# Patient Record
Sex: Male | Born: 1980 | Race: White | Hispanic: No | Marital: Married
Health system: Southern US, Community
[De-identification: ages and names within clinical notes are randomized; demographics above are authoritative.]

## PROBLEM LIST (undated history)

## (undated) DIAGNOSIS — I10 Essential (primary) hypertension: Secondary | ICD-10-CM

---

## 2018-01-02 ENCOUNTER — Emergency Department (HOSPITAL_COMMUNITY): Payer: BLUE CROSS/BLUE SHIELD

## 2018-01-02 ENCOUNTER — Encounter (HOSPITAL_COMMUNITY)
Admission: EM | Disposition: A | Discharge status: Home / Self Care | Payer: Self-pay | Source: Home / Self Care | Attending: Neurology

## 2018-01-02 ENCOUNTER — Emergency Department (HOSPITAL_COMMUNITY): Payer: BLUE CROSS/BLUE SHIELD | Admitting: Anesthesiology

## 2018-01-02 ENCOUNTER — Inpatient Hospital Stay (HOSPITAL_COMMUNITY)
Admission: EM | Admit: 2018-01-02 | Discharge: 2018-01-05 | DRG: 023 | Disposition: A | Discharge status: Home / Self Care | Payer: BLUE CROSS/BLUE SHIELD | Attending: Neurology | Admitting: Neurology

## 2018-01-02 DIAGNOSIS — R29702 NIHSS score 2: Secondary | ICD-10-CM | POA: Diagnosis not present

## 2018-01-02 DIAGNOSIS — E7849 Other hyperlipidemia: Secondary | ICD-10-CM | POA: Diagnosis present

## 2018-01-02 DIAGNOSIS — Z23 Encounter for immunization: Secondary | ICD-10-CM | POA: Diagnosis not present

## 2018-01-02 DIAGNOSIS — R29706 NIHSS score 6: Secondary | ICD-10-CM | POA: Diagnosis present

## 2018-01-02 DIAGNOSIS — H53461 Homonymous bilateral field defects, right side: Secondary | ICD-10-CM | POA: Diagnosis present

## 2018-01-02 DIAGNOSIS — R471 Dysarthria and anarthria: Secondary | ICD-10-CM | POA: Diagnosis present

## 2018-01-02 DIAGNOSIS — M7989 Other specified soft tissue disorders: Secondary | ICD-10-CM | POA: Diagnosis not present

## 2018-01-02 DIAGNOSIS — E782 Mixed hyperlipidemia: Secondary | ICD-10-CM | POA: Diagnosis not present

## 2018-01-02 DIAGNOSIS — I10 Essential (primary) hypertension: Secondary | ICD-10-CM | POA: Diagnosis present

## 2018-01-02 DIAGNOSIS — I63532 Cerebral infarction due to unspecified occlusion or stenosis of left posterior cerebral artery: Secondary | ICD-10-CM | POA: Diagnosis not present

## 2018-01-02 DIAGNOSIS — E785 Hyperlipidemia, unspecified: Secondary | ICD-10-CM | POA: Diagnosis not present

## 2018-01-02 DIAGNOSIS — R600 Localized edema: Secondary | ICD-10-CM | POA: Diagnosis not present

## 2018-01-02 DIAGNOSIS — E669 Obesity, unspecified: Secondary | ICD-10-CM | POA: Diagnosis present

## 2018-01-02 DIAGNOSIS — I6340 Cerebral infarction due to embolism of unspecified cerebral artery: Secondary | ICD-10-CM | POA: Diagnosis not present

## 2018-01-02 DIAGNOSIS — I63212 Cerebral infarction due to unspecified occlusion or stenosis of left vertebral arteries: Secondary | ICD-10-CM | POA: Diagnosis not present

## 2018-01-02 DIAGNOSIS — I639 Cerebral infarction, unspecified: Secondary | ICD-10-CM

## 2018-01-02 DIAGNOSIS — R1310 Dysphagia, unspecified: Secondary | ICD-10-CM | POA: Diagnosis present

## 2018-01-02 DIAGNOSIS — Q208 Other congenital malformations of cardiac chambers and connections: Secondary | ICD-10-CM

## 2018-01-02 DIAGNOSIS — I7774 Dissection of vertebral artery: Secondary | ICD-10-CM | POA: Diagnosis present

## 2018-01-02 DIAGNOSIS — R609 Edema, unspecified: Secondary | ICD-10-CM | POA: Diagnosis not present

## 2018-01-02 DIAGNOSIS — Z6841 Body Mass Index (BMI) 40.0 and over, adult: Secondary | ICD-10-CM

## 2018-01-02 HISTORY — PX: IR INTRA CRAN STENT: IMG2345

## 2018-01-02 HISTORY — PX: IR PERCUTANEOUS ART THROMBECTOMY/INFUSION INTRACRANIAL INC DIAG ANGIO: IMG6087

## 2018-01-02 HISTORY — PX: IR ANGIO VERTEBRAL SEL VERTEBRAL UNI R MOD SED: IMG5368

## 2018-01-02 HISTORY — PX: RADIOLOGY WITH ANESTHESIA: SHX6223

## 2018-01-02 HISTORY — PX: IR ANGIO INTRA EXTRACRAN SEL COM CAROTID INNOMINATE BILAT MOD SED: IMG5360

## 2018-01-02 HISTORY — PX: IR CT HEAD LTD: IMG2386

## 2018-01-02 HISTORY — DX: Essential (primary) hypertension: I10

## 2018-01-02 LAB — I-STAT CHEM 8, ED
BUN: 14 mg/dL (ref 6–20)
CHLORIDE: 105 mmol/L (ref 98–111)
CREATININE: 0.8 mg/dL (ref 0.61–1.24)
Calcium, Ion: 1.04 mmol/L — ABNORMAL LOW (ref 1.15–1.40)
GLUCOSE: 122 mg/dL — AB (ref 70–99)
HCT: 46 % (ref 39.0–52.0)
Hemoglobin: 15.6 g/dL (ref 13.0–17.0)
POTASSIUM: 3.2 mmol/L — AB (ref 3.5–5.1)
Sodium: 140 mmol/L (ref 135–145)
TCO2: 23 mmol/L (ref 22–32)

## 2018-01-02 LAB — COMPREHENSIVE METABOLIC PANEL
ALT: 34 U/L (ref 0–44)
AST: 27 U/L (ref 15–41)
Albumin: 4.2 g/dL (ref 3.5–5.0)
Alkaline Phosphatase: 58 U/L (ref 38–126)
Anion gap: 14 (ref 5–15)
BUN: 12 mg/dL (ref 6–20)
CALCIUM: 9.1 mg/dL (ref 8.9–10.3)
CO2: 22 mmol/L (ref 22–32)
CREATININE: 0.85 mg/dL (ref 0.61–1.24)
Chloride: 104 mmol/L (ref 98–111)
Glucose, Bld: 120 mg/dL — ABNORMAL HIGH (ref 70–99)
POTASSIUM: 3.1 mmol/L — AB (ref 3.5–5.1)
Sodium: 140 mmol/L (ref 135–145)
Total Bilirubin: 1 mg/dL (ref 0.3–1.2)
Total Protein: 7.2 g/dL (ref 6.5–8.1)

## 2018-01-02 LAB — PROTIME-INR
INR: 1.04
Prothrombin Time: 13.5 seconds (ref 11.4–15.2)

## 2018-01-02 LAB — DIFFERENTIAL
ABS IMMATURE GRANULOCYTES: 0.03 10*3/uL (ref 0.00–0.07)
BASOS ABS: 0.1 10*3/uL (ref 0.0–0.1)
BASOS PCT: 1 %
Eosinophils Absolute: 0.1 10*3/uL (ref 0.0–0.5)
Eosinophils Relative: 1 %
Immature Granulocytes: 0 %
Lymphocytes Relative: 25 %
Lymphs Abs: 2.2 10*3/uL (ref 0.7–4.0)
MONOS PCT: 10 %
Monocytes Absolute: 0.8 10*3/uL (ref 0.1–1.0)
NEUTROS PCT: 63 %
Neutro Abs: 5.6 10*3/uL (ref 1.7–7.7)

## 2018-01-02 LAB — CBC
HCT: 46.1 % (ref 39.0–52.0)
Hemoglobin: 15.6 g/dL (ref 13.0–17.0)
MCH: 30.8 pg (ref 26.0–34.0)
MCHC: 33.8 g/dL (ref 30.0–36.0)
MCV: 91.1 fL (ref 80.0–100.0)
NRBC: 0 % (ref 0.0–0.2)
Platelets: 242 10*3/uL (ref 150–400)
RBC: 5.06 MIL/uL (ref 4.22–5.81)
RDW: 12.5 % (ref 11.5–15.5)
WBC: 8.7 10*3/uL (ref 4.0–10.5)

## 2018-01-02 LAB — RAPID URINE DRUG SCREEN, HOSP PERFORMED
AMPHETAMINES: NOT DETECTED
BARBITURATES: NOT DETECTED
BENZODIAZEPINES: NOT DETECTED
COCAINE: NOT DETECTED
OPIATES: NOT DETECTED
Tetrahydrocannabinol: NOT DETECTED

## 2018-01-02 LAB — CBG MONITORING, ED: Glucose-Capillary: 109 mg/dL — ABNORMAL HIGH (ref 70–99)

## 2018-01-02 LAB — MRSA PCR SCREENING: MRSA by PCR: NEGATIVE

## 2018-01-02 LAB — APTT: APTT: 25 s (ref 24–36)

## 2018-01-02 LAB — I-STAT TROPONIN, ED: TROPONIN I, POC: 0 ng/mL (ref 0.00–0.08)

## 2018-01-02 SURGERY — IR WITH ANESTHESIA
Anesthesia: General

## 2018-01-02 MED ORDER — TICAGRELOR 90 MG PO TABS: 90.0000 mg | ORAL_TABLET | Freq: Two times a day (BID) | ORAL | Status: DC

## 2018-01-02 MED ORDER — PROPOFOL 10 MG/ML IV BOLUS
INTRAVENOUS | Status: DC | PRN
  Administered 2018-01-02: 200 mg via INTRAVENOUS

## 2018-01-02 MED ORDER — NITROGLYCERIN 1 MG/10 ML FOR IR/CATH LAB
INTRA_ARTERIAL | Status: AC | PRN
  Administered 2018-01-02: 25 ug via INTRA_ARTERIAL

## 2018-01-02 MED ORDER — ASPIRIN 81 MG PO CHEW: 81.0000 mg | CHEWABLE_TABLET | Freq: Every day | ORAL | Status: DC

## 2018-01-02 MED ORDER — SENNOSIDES-DOCUSATE SODIUM 8.6-50 MG PO TABS: 1.0000 | ORAL_TABLET | Freq: Every evening | ORAL | Status: DC | PRN

## 2018-01-02 MED ORDER — FENTANYL CITRATE (PF) 100 MCG/2ML IJ SOLN
INTRAMUSCULAR | Status: AC
  Filled 2018-01-02: qty 2

## 2018-01-02 MED ORDER — ASPIRIN 325 MG PO TABS
ORAL_TABLET | ORAL | Status: AC
  Filled 2018-01-02: qty 1

## 2018-01-02 MED ORDER — PHENYLEPHRINE HCL 10 MG/ML IJ SOLN
INTRAMUSCULAR | Status: DC | PRN
  Administered 2018-01-02 (×3): 40 ug via INTRAVENOUS

## 2018-01-02 MED ORDER — TIROFIBAN HCL IN NACL 5-0.9 MG/100ML-% IV SOLN
INTRAVENOUS | Status: AC
  Filled 2018-01-02: qty 100

## 2018-01-02 MED ORDER — CEFAZOLIN SODIUM-DEXTROSE 2-3 GM-%(50ML) IV SOLR
INTRAVENOUS | Status: DC | PRN
  Administered 2018-01-02: 2 g via INTRAVENOUS

## 2018-01-02 MED ORDER — IOPAMIDOL (ISOVUE-370) INJECTION 76%
100.0000 mL | Freq: Once | INTRAVENOUS | Status: AC | PRN
  Administered 2018-01-02: 40 mL via INTRAVENOUS

## 2018-01-02 MED ORDER — FENTANYL CITRATE (PF) 250 MCG/5ML IJ SOLN
INTRAMUSCULAR | Status: DC | PRN
  Administered 2018-01-02 (×3): 50 ug via INTRAVENOUS

## 2018-01-02 MED ORDER — LIDOCAINE HCL (CARDIAC) PF 100 MG/5ML IV SOSY
PREFILLED_SYRINGE | INTRAVENOUS | Status: DC | PRN
  Administered 2018-01-02: 100 mg via INTRATRACHEAL

## 2018-01-02 MED ORDER — EPTIFIBATIDE 20 MG/10ML IV SOLN
INTRAVENOUS | Status: AC
  Filled 2018-01-02: qty 10

## 2018-01-02 MED ORDER — SUCCINYLCHOLINE CHLORIDE 20 MG/ML IJ SOLN
INTRAMUSCULAR | Status: DC | PRN
  Administered 2018-01-02: 100 mg via INTRAVENOUS

## 2018-01-02 MED ORDER — CLEVIDIPINE BUTYRATE 0.5 MG/ML IV EMUL
0.0000 mg/h | INTRAVENOUS | Status: DC
  Administered 2018-01-02: 10 mg/h via INTRAVENOUS
  Administered 2018-01-02 – 2018-01-03 (×7): 32 mg/h via INTRAVENOUS
  Filled 2018-01-02: qty 50
  Filled 2018-01-02: qty 100
  Filled 2018-01-02 (×2): qty 50
  Filled 2018-01-02 (×2): qty 100

## 2018-01-02 MED ORDER — IOPAMIDOL (ISOVUE-300) INJECTION 61%
INTRAVENOUS | Status: AC
  Filled 2018-01-02: qty 50

## 2018-01-02 MED ORDER — LABETALOL HCL 5 MG/ML IV SOLN
20.0000 mg | Freq: Once | INTRAVENOUS | Status: AC
  Administered 2018-01-02 (×2): 5 mg via INTRAVENOUS
  Administered 2018-01-02: 10 mg via INTRAVENOUS

## 2018-01-02 MED ORDER — IOPAMIDOL (ISOVUE-370) INJECTION 76%
100.0000 mL | Freq: Once | INTRAVENOUS | Status: AC | PRN
  Administered 2018-01-02: 100 mL via INTRAVENOUS

## 2018-01-02 MED ORDER — CEFAZOLIN SODIUM-DEXTROSE 2-4 GM/100ML-% IV SOLN
INTRAVENOUS | Status: AC
  Filled 2018-01-02: qty 100

## 2018-01-02 MED ORDER — ACETAMINOPHEN 325 MG PO TABS
650.0000 mg | ORAL_TABLET | ORAL | Status: DC | PRN
  Administered 2018-01-02 – 2018-01-04 (×6): 650 mg via ORAL
  Filled 2018-01-02 (×6): qty 2

## 2018-01-02 MED ORDER — SODIUM CHLORIDE 0.9 % IV SOLN
INTRAVENOUS | Status: DC | PRN
  Administered 2018-01-02 (×2): via INTRAVENOUS

## 2018-01-02 MED ORDER — ASPIRIN 81 MG PO CHEW
81.0000 mg | CHEWABLE_TABLET | Freq: Every day | ORAL | Status: DC
  Administered 2018-01-03 – 2018-01-05 (×3): 81 mg via ORAL
  Filled 2018-01-02 (×3): qty 1

## 2018-01-02 MED ORDER — IOHEXOL 300 MG/ML  SOLN
150.0000 mL | Freq: Once | INTRAMUSCULAR | Status: AC | PRN
  Administered 2018-01-02: 75 mL via INTRA_ARTERIAL

## 2018-01-02 MED ORDER — NICARDIPINE HCL IN NACL 20-0.86 MG/200ML-% IV SOLN: 0.0000 mg/h | INTRAVENOUS | Status: DC

## 2018-01-02 MED ORDER — CLOPIDOGREL BISULFATE 300 MG PO TABS
ORAL_TABLET | ORAL | Status: AC
  Filled 2018-01-02: qty 1

## 2018-01-02 MED ORDER — SODIUM CHLORIDE 0.9 % IV SOLN: INTRAVENOUS | Status: DC

## 2018-01-02 MED ORDER — TICAGRELOR 90 MG PO TABS
ORAL_TABLET | ORAL | Status: AC
  Filled 2018-01-02: qty 2

## 2018-01-02 MED ORDER — IOHEXOL 300 MG/ML  SOLN
150.0000 mL | Freq: Once | INTRAMUSCULAR | Status: AC | PRN
  Administered 2018-01-02: 50 mL via INTRA_ARTERIAL

## 2018-01-02 MED ORDER — TICAGRELOR 90 MG PO TABS
90.0000 mg | ORAL_TABLET | Freq: Two times a day (BID) | ORAL | Status: DC
  Administered 2018-01-03 (×2): 90 mg via ORAL
  Filled 2018-01-02 (×3): qty 1

## 2018-01-02 MED ORDER — ONDANSETRON HCL 4 MG/2ML IJ SOLN
INTRAMUSCULAR | Status: DC | PRN
  Administered 2018-01-02: 4 mg via INTRAVENOUS

## 2018-01-02 MED ORDER — SODIUM CHLORIDE 0.9 % IV SOLN: 50.0000 mL | Freq: Once | INTRAVENOUS | Status: DC

## 2018-01-02 MED ORDER — SODIUM CHLORIDE 0.9 % IV SOLN
INTRAVENOUS | Status: DC | PRN
  Administered 2018-01-02: 20 ug/min via INTRAVENOUS

## 2018-01-02 MED ORDER — SODIUM CHLORIDE 0.9 % IV SOLN
INTRAVENOUS | Status: DC
  Administered 2018-01-02: 20:00:00 via INTRAVENOUS

## 2018-01-02 MED ORDER — ROCURONIUM BROMIDE 100 MG/10ML IV SOLN
INTRAVENOUS | Status: DC | PRN
  Administered 2018-01-02 (×3): 50 mg via INTRAVENOUS

## 2018-01-02 MED ORDER — LIDOCAINE HCL 1 % IJ SOLN
INTRAMUSCULAR | Status: AC
  Filled 2018-01-02: qty 20

## 2018-01-02 MED ORDER — ACETAMINOPHEN 325 MG PO TABS: 650.0000 mg | ORAL_TABLET | ORAL | Status: DC | PRN

## 2018-01-02 MED ORDER — SUGAMMADEX SODIUM 500 MG/5ML IV SOLN
INTRAVENOUS | Status: DC | PRN
  Administered 2018-01-02: 500 mg via INTRAVENOUS

## 2018-01-02 MED ORDER — NITROGLYCERIN 1 MG/10 ML FOR IR/CATH LAB
INTRA_ARTERIAL | Status: AC
  Filled 2018-01-02: qty 10

## 2018-01-02 MED ORDER — LABETALOL HCL 5 MG/ML IV SOLN
20.0000 mg | Freq: Once | INTRAVENOUS | Status: AC
  Administered 2018-01-02: 20 mg via INTRAVENOUS
  Filled 2018-01-02: qty 4

## 2018-01-02 MED ORDER — LABETALOL HCL 5 MG/ML IV SOLN
INTRAVENOUS | Status: DC | PRN
  Administered 2018-01-02: 10 mg via INTRAVENOUS

## 2018-01-02 MED ORDER — ASPIRIN EC 81 MG PO TBEC
DELAYED_RELEASE_TABLET | ORAL | Status: AC | PRN
  Administered 2018-01-02: 325 mg via ORAL

## 2018-01-02 MED ORDER — ACETAMINOPHEN 650 MG RE SUPP: 650.0000 mg | RECTAL | Status: DC | PRN

## 2018-01-02 MED ORDER — ACETAMINOPHEN 160 MG/5ML PO SOLN: 650.0000 mg | ORAL | Status: DC | PRN

## 2018-01-02 MED ORDER — TICAGRELOR 60 MG PO TABS
ORAL_TABLET | ORAL | Status: AC | PRN
  Administered 2018-01-02: 180 mg via NASOGASTRIC

## 2018-01-02 MED ORDER — STROKE: EARLY STAGES OF RECOVERY BOOK
Freq: Once | Status: DC
  Filled 2018-01-02: qty 1

## 2018-01-02 MED ORDER — ALTEPLASE (STROKE) FULL DOSE INFUSION
90.0000 mg | Freq: Once | INTRAVENOUS | Status: AC
  Administered 2018-01-02: 90 mg via INTRAVENOUS
  Filled 2018-01-02: qty 100

## 2018-01-02 MED ORDER — FAMOTIDINE IN NACL 20-0.9 MG/50ML-% IV SOLN
20.0000 mg | Freq: Two times a day (BID) | INTRAVENOUS | Status: DC
  Administered 2018-01-03: 20 mg via INTRAVENOUS
  Filled 2018-01-02: qty 50

## 2018-01-02 MED ORDER — LABETALOL HCL 5 MG/ML IV SOLN
10.0000 mg | INTRAVENOUS | Status: DC | PRN
  Administered 2018-01-02: 10 mg via INTRAVENOUS
  Filled 2018-01-02: qty 4

## 2018-01-02 NOTE — H&P (Addendum)
Neurology Consultation  CC: Right-sided weakness, slurred speech  History is obtained from patient  HPI: Gary Steele is a 37 y.o. male, who is visiting [place] from out of town for a work meeting, with past medical history of hypertension, who was in his usual state of health till about 1 PM on 01/02/2018 when he suddenly started noticing blurred vision.  Soon after his colleagues noted that his speech was slurred and EMS was called immediately and he was brought into the emergency room for evaluation. On initial evaluation, he had a bizarre looking, nearly functional exam with possible right hemianopsia but he also reported blurred vision on his left visual field.  He was able to name some objects but not able to read the cards. Noncontrast CT of the head was performed which did not show any acute changes.  CT angiogram of the head and neck was performed and on first glance and preliminary review did not show any acute abnormalities  Due to the inconsistent exam and no certain findings on the CT and CTA initially, he was taken in for a stat MRI of the brain that had shown DWI changes in the left PCA territory.  While the patient was an MRI, CTA was reviewed again by radiology and was reported to have a left vertebral artery dissection and a possible left PCA-P2 segment occlusion with widely patent cervical carotid arteries. IV TPA was not initially started because of inconsistent exam and preliminary findings of normal imaging, but with the abnormalities on CT as well as early DWI changes, TPA was then started with some delay. Following IV TPA demonstration, he was taken into the IR suite for endovascular thrombectomy. Upon obtaining more history from him as we are waiting in the IR suite, he said that he woke up with some neck discomfort this morning but had no neurological symptoms that were focal.  The focal neurological symptoms including blurred vision and slurred speech all started after  1 PM. He has never had similar symptoms in the past.  He has not had chiropractic maneuver.  He has not had any whiplash injury to the neck.  LKW: 1 PM on 01/02/2018 tpa given?:  Yes Delays in the process of giving TPA and endovascular thrombectomy- unclear and inconsistent exam to begin with, preliminary reading of CTA not remarkable for large vessel occlusion due to poor bolus timing. Premorbid modified Rankin scale (mRS):0 ROS: Review of systems was performed and is negative except as noted in the HPI  Past medical history is significant for hypertension  No family history of strokes  Social History:  Denies tobacco or illicit drug use. Reports 3-4 drinks of alcohol every night.  Medications  Current Facility-Administered Medications:  .   stroke: mapping our early stages of recovery book, , Does not apply, Once, Ulice Dash, PA-C .  alteplase (ACTIVASE) 1 mg/mL infusion 90 mg, 90 mg, Intravenous, Once, Last Rate: 90 mL/hr at 01/02/18 1451, 90 mg at 01/02/18 1451 **FOLLOWED BY** 0.9 %  sodium chloride infusion, 50 mL, Intravenous, Once, Milon Dikes, MD .  0.9 %  sodium chloride infusion, , Intravenous, Continuous, Ulice Dash, PA-C .  acetaminophen (TYLENOL) tablet 650 mg, 650 mg, Oral, Q4H PRN **OR** acetaminophen (TYLENOL) solution 650 mg, 650 mg, Per Tube, Q4H PRN **OR** acetaminophen (TYLENOL) suppository 650 mg, 650 mg, Rectal, Q4H PRN, Ulice Dash, PA-C .  aspirin 325 MG tablet, , , ,  .  ceFAZolin (ANCEF) 2-4 GM/100ML-% IVPB, , , ,  .  clopidogrel (PLAVIX) 300 MG tablet, , , ,  .  eptifibatide (INTEGRILIN) 20 MG/10ML injection, , , ,  .  famotidine (PEPCID) IVPB 20 mg premix, 20 mg, Intravenous, Q12H, Ulice Dash, PA-C .  labetalol (NORMODYNE,TRANDATE) injection 20 mg, 20 mg, Intravenous, Once **AND** nicardipine (CARDENE) 20mg  in 0.86% saline IV infusion (0.1 mg/ml), 0-15 mg/hr, Intravenous, Continuous, Milon Dikes, MD .  lidocaine (XYLOCAINE) 1 % (with  pres) injection, , , ,  .  lidocaine (XYLOCAINE) 1 % (with pres) injection, , , ,  .  nitroGLYCERIN 100 mcg/mL intra-arterial injection, , , ,  .  senna-docusate (Senokot-S) tablet 1 tablet, 1 tablet, Oral, QHS PRN, Ulice Dash, PA-C .  ticagrelor (BRILINTA) 90 MG tablet, , , ,  .  tirofiban (AGGRASTAT) 5-0.9 MG/100ML-% injection, , , ,  No current outpatient medications on file.  Facility-Administered Medications Ordered in Other Encounters:  .  0.9 %  sodium chloride infusion, , , Continuous PRN, Jeani Hawking, CRNA .  fentaNYL (SUBLIMAZE) injection, , , Anesthesia Intra-op, Jeani Hawking, CRNA, 50 mcg at 01/02/18 1511 .  labetalol (NORMODYNE,TRANDATE) injection, , , Anesthesia Intra-op, Jeani Hawking, CRNA, 10 mg at 01/02/18 1517 .  lidocaine (cardiac) 100 mg/53mL (XYLOCAINE) injection 2%, , Tracheal Tube, Anesthesia Intra-op, Jeani Hawking, CRNA, 100 mg at 01/02/18 1511 .  propofol (DIPRIVAN) 10 mg/mL bolus/IV push, , , Anesthesia Intra-op, Jeani Hawking, CRNA, 200 mg at 01/02/18 1511 .  rocuronium (ZEMURON) injection, , , Anesthesia Intra-op, Jeani Hawking, CRNA, 50 mg at 01/02/18 1519 .  succinylcholine (ANECTINE) injection, , , Anesthesia Intra-op, Jeani Hawking, CRNA, 100 mg at 01/02/18 1511  Exam: Current vital signs: BP 128/73 Comment: rechecked on IR monitor, new BP 155/99  Pulse (!) 106   Temp 98 F (36.7 C) (Oral)   Resp 19   Wt 126.9 kg   SpO2 97%  Vital signs in last 24 hours: Temp:  [98 F (36.7 C)] 98 F (36.7 C) (10/08 1450) Pulse Rate:  [100-106] 106 (10/08 1500) Resp:  [19-20] 19 (10/08 1500) BP: (128-168)/(73-102) 128/73 (10/08 1500) SpO2:  [97 %] 97 % (10/08 1500) Weight:  [126.9 kg] 126.9 kg (10/08 1300) General: Obese man in no apparent distress HEENT: Normocephalic atraumatic dry mucous membranes Lungs: Clear to auscultation bilaterally with no wheezing Cardiovascular: S1-S2 heard regular rate rhythm no murmur rub  gallop Extremities: Warm well perfused Abdomen nondistended nontender Neurological exam Patient awake, alert, seems to be aware of the fact that he is visiting a city outside of where he lives for a conference but could not name the city.  Was aware of the date. During the course of this encounter, he was aware that he was in [facility]. His speech was mildly dysarthric. He was able to name simple objects such as pen, watch but was unable to name the objects on the cart as he said that he cannot see it in either the right or the left visual field. 's comp attention was intact His repetition was intact Cranial nerves: Pupils were 6 mm bilaterally and reactive, extraocular movements were intact, on initial exam he had right homonymous hemianopsia and also reported blurred vision on the left visual fields but was able to see fingers moving on the left visual field.  No facial droop or weakness was noted.  No facial sensation changes.  Tongue midline.  Uvula midline.  Shoulder shrug intact. Motor exam: Initial encounter was able to lift  his right arm and leg up against gravity with no drift but later on had drift in the right upper extremity.  No drift in the left upper lower extremity. Sensory exam: Was able to initially feel the right side although less than the left but later on revealed that he has no sensation on the right upper or lower extremity. Coordination: Unable to perform on the right.  Intact finger-nose-finger and heel-knee-shin on the left. Gait testing was deferred at this time DTRs were 2+ all over Initial NIH stroke scale-6 NIH stroke scale fluctuated from 4-8  Labs I have reviewed labs in epic and the results pertinent to this consultation are: CBC    Component Value Date/Time   WBC 8.7 01/02/2018 1350   RBC 5.06 01/02/2018 1350   HGB 15.6 01/02/2018 1356   HCT 46.0 01/02/2018 1356   PLT 242 01/02/2018 1350   MCV 91.1 01/02/2018 1350   MCH 30.8 01/02/2018  1350   MCHC 33.8 01/02/2018 1350   RDW 12.5 01/02/2018 1350   LYMPHSABS 2.2 01/02/2018 1350   MONOABS 0.8 01/02/2018 1350   EOSABS 0.1 01/02/2018 1350   BASOSABS 0.1 01/02/2018 1350   CMP     Component Value Date/Time   NA 140 01/02/2018 1356   K 3.2 (L) 01/02/2018 1356   CL 105 01/02/2018 1356   CO2 22 01/02/2018 1350   GLUCOSE 122 (H) 01/02/2018 1356   BUN 14 01/02/2018 1356   CREATININE 0.80 01/02/2018 1356   CALCIUM 9.1 01/02/2018 1350   PROT 7.2 01/02/2018 1350   ALBUMIN 4.2 01/02/2018 1350   AST 27 01/02/2018 1350   ALT 34 01/02/2018 1350   ALKPHOS 58 01/02/2018 1350   BILITOT 1.0 01/02/2018 1350   GFRNONAA >60 01/02/2018 1350   GFRAA >60 01/02/2018 1350   Imaging I have reviewed the images obtained:  CT-scan of the brain-noncontrast CT of the head-no acute changes CTA head and neck-preliminary review with radiology was negative for LVO but further review revealed a possible left V1 dissection and a left P2 occlusion- suboptimal quality of the fluid bolus. CT perfusion of the neck is uninterpretable. Stat limited MRI examination of the brain- moderately large acute left PCA infarct with smaller acute infarcts in the left cerebellum and right pons.  Assessment:  Mr. Mera is a 37 year old man with a history of hypertension, brought in as an acute code stroke for slurred speech and right-sided weakness, who on initial encounter and had an inconsistent exam and initial head CT that was unremarkable.  CT Angie of the head and neck was degraded by the suboptimal quality of the fluid bolus and preliminary review did not reveal a large vessel occlusion. Initially, TPA was not considered for his treatment due to inconsistent exam and no apparent abnormalities on the CT and CT Angie of head and neck.  He was taken for a stat MRI of the brain because his exam still was not normal and did not make much sense in terms of localization. As he was in the MRI, the CTA was reviewed  further by radiology and a possible acute left for dissection and left P2 occlusion was discovered. Right after the MRI was completed, he was given IV TPA after discussing risks and benefits and he consented to the TPA. Endovascular thrombectomy was also discussed with him and he consented to proceed and was taken to the IR suite.  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 -Hyperglycemia management per SSI to maintain glucose 140-180mg /dL. -PT/OT/ST therapies and recommendations when able  CNS -Close neuro monitoring  Dysarthria Dysphagia following cerebral infarction  -NPO until cleared by speech -ST -Advance diet as tolerated -May need PEG  Hemiplegia and hemiparesis following cerebral infarction affecting right dominant side -PT/OT -PM&R consult  RESP Being intubated for general anesthesia for the IR procedure. Upon arrival had no acute respiratory issues. We will attempt to extubate in the IR suite if possible.  If not, will request PCCM consultation for vent management  CV Essential (primary) hypertension -Blood pressure goals as above -TTE  Hyperlipidemia, unspecified  - Statin for goal LDL < 70  HEME No acute issues -Monitor -transfuse for hgb < 7  ENDO -goal HgbA1c < 7  GI/GU -Gentle hydration  Fluid/Electrolyte Disorders -Repeat labs -Replete electrolytes as necessary  ID -No active issues -Monitor clinically and with labs  Nutrition E66.9 Obesity  -diet consult  Prophylaxis DVT: SCDs until 24 hours post TPA scan shows no bleed GI: Per PCCM vent bundle if needed Bowel: Docusate senna  Diet: NPO until cleared by speech for bedside swallow evaluation  Code Status: Full Code    I spoke to his wife  Cammy Copa over the phone.  I explained to her the symptoms that he came with and the treatment that he is receiving.  She is on her way to [facility] now.  I have provided her with my cell phone number and will update her after the IR procedure.    THE FOLLOWING WERE PRESENT ON ADMISSION: Acute ischemic stroke, left vertebral artery dissection, hemiparesis, hemianopsia, dysarthria, dysphagia.  -- Milon Dikes, MD [facility] Pager: [phone] If 7pm to 7am, please call on call as listed on AMION.   CRITICAL CARE ATTESTATION This patient is critically ill and at significant risk of neurological worsening, death and care requires constant monitoring of vital signs, hemodynamics, respiratory, and cardiac monitoring. I spent 75  minutes of neurocritical care time performing neurological assessment, discussion with family, other specialists and medical decision making of high complexity in the care of  this patient.

## 2018-01-02 NOTE — ED Triage Notes (Signed)
Patient arrived via EMS, from [employer] a work [employer]). Patient's co-worker called EMS stating patient was confused and had slurred speech. EMS arrived to find that patient has right side weakness, slurred speech, confused, and blurry vision.  Last Known well was 1300

## 2018-01-02 NOTE — Code Documentation (Signed)
37yo male arriving to [facility] via [facility] at 26. Patient from a work conference where he describes having sudden onset blurred vision and right sided numbness at 1300. EMS called and activated a code stroke for slurred speech and right sided weakness. Stroke team at the bedside on patient arrival. Labs drawn and patient cleared for CT by Dr. Hyacinth Meeker. Patient to CT with team. CT completed. NIHSS 6, see documentation for details and code stroke times. Patient with right hemianopsia, loss of sensation on the right side and mild dysarthria on exam. Patient with inconsistent exam as he reports not being able to feel on the right side, however, responds to PIV start. CTA and CTP completed. Patient transported back to [facility]. Decision made to proceed to STAT MRI which was positive for acute infarct. Decision made to treat with tPA. Pharmacy notified and presented to MRI to mix tPA. BP reassessed and within tPA parameters. 9mg  tPA bolus given over 1 minute at 1451 followed by 81mg /hr for a total of 90mg  per pharmacy dosing. Of note, patient reports waking up with neck pain but denies neurological deficits until 1300 today. Denies recent neck manipulation or injury. Patient transferred to IR per MD and bedside handoff given to IR RN. Patient reassessed with no change in exam. Patient with DBP above tPA parameters, reassessed and within parameters on reassessment. Patient to be admitted to ICU post-procedure.

## 2018-01-02 NOTE — Progress Notes (Signed)
Patient ID: Gary Steele, male   DOB: Mar 04, 1981, 37 y.o.   MRN: [id number] INR. Post procedure dyna CT brain reveals no gross ICH,or mass effect. Patient extubated with out diifficulty. Maintaining O2 sats. Denies any H/As,N/V C/O tingling in Rt UE,and improved vision. RT UE mild pronation drift. Pupils  3 to 4 mm RT = LT briskly reactive. RT groin soft No hematoma.Distal pulses palpable DPs and PTs bilaterally. S.Leonila Speranza MD

## 2018-01-02 NOTE — Progress Notes (Signed)
Spoke with pt wife in 4N waiting area, she is appreciative of IR services. Gave report to [place], [place].

## 2018-01-02 NOTE — Progress Notes (Signed)
Post IR NIHSS 2 (right upper quadrantonopsia, sensory)  -- Milon Dikes, MD [facility] Pager: [phone] If 7pm to 7am, please call on call as listed on AMION.

## 2018-01-02 NOTE — Transfer of Care (Signed)
Immediate Anesthesia Transfer of Care Note  Patient: Gary Steele  Procedure(s) Performed: IR WITH ANESTHESIA (N/A )  Patient Location: PACU  Anesthesia Type:General  Level of Consciousness: awake, alert  and oriented  Airway & Oxygen Therapy: Patient Spontanous Breathing and Patient connected to nasal cannula oxygen  Post-op Assessment: Report given to RN, Post -op Vital signs reviewed and stable and Patient moving all extremities  Post vital signs: Reviewed and stable  Last Vitals:  Vitals Value Taken Time  BP    Temp    Pulse 83 01/02/2018  6:12 PM  Resp 15 01/02/2018  6:12 PM  SpO2 99 % 01/02/2018  6:12 PM  Vitals shown include unvalidated device data.  Last Pain:  Vitals:   01/02/18 1450  TempSrc: Oral  PainSc:          Complications: No apparent anesthesia complications

## 2018-01-02 NOTE — Progress Notes (Signed)
Report given at beside in PACU, groin level 0, Pt VSS.

## 2018-01-02 NOTE — Progress Notes (Signed)
IR RN states she will complete and document 18:15 NIH.

## 2018-01-02 NOTE — Anesthesia Procedure Notes (Signed)
Arterial Line Insertion Start/End10/10/2017 3:20 PM, 01/02/2018 3:23 PM Performed by: Quentin Ore, CRNA, CRNA  Patient location: OR. Preanesthetic checklist: patient identified, IV checked, site marked, risks and benefits discussed, surgical consent, monitors and equipment checked, pre-op evaluation, timeout performed and anesthesia consent Emergency situation Left, radial was placed Catheter size: 20 G Maximum sterile barriers used   Attempts: 2 Procedure performed without using ultrasound guided technique. Following insertion, dressing applied. Post procedure assessment: normal  Patient tolerated the procedure well with no immediate complications.

## 2018-01-02 NOTE — Sedation Documentation (Signed)
Patient in IR suite 2 in care of anesthesia. This RN and Leigh in room to assist

## 2018-01-02 NOTE — Progress Notes (Signed)
CT scan performed in IR

## 2018-01-02 NOTE — Progress Notes (Signed)
Pharmacist Code Stroke Response  Notified to mix tPA at 1442 by Dr. Wilford Corner Delivered tPA to RN at 1449  tPA dose = 9mg  bolus over 1 minute followed by 81mg  for a total dose of 90mg  over 1 hour  Issues/delays encountered (if applicable): Pharmacy previously told pt would not receive tPA, informed to give tPA when I was not available in the ED  Gary Steele, Drake Leach 01/02/18 2:51 PM

## 2018-01-02 NOTE — Progress Notes (Signed)
Pt remains under the care of anesthesia. Pt is intubated, VSS

## 2018-01-02 NOTE — Progress Notes (Signed)
Paged Dr. Wilford Corner regarding patients inability to urinate, distention, and urgency. Due to tpa administration foley was placed to decrease chances of bleeding. Order placed. Will continue to monitor. Dicie Beam RN BSN.

## 2018-01-02 NOTE — Procedures (Signed)
S/P Bilateral Vert ,bilateral common carotid arteriogram followed by completye revascularization of occluded Lt PCA P1 with x 1 pass with embotrap 5 mmx 33 mm retriever device achievins a TICI 3 revascularization and reconstructing   acutely dissected Lt VA at V2 ,V3 and V4  segs with string sign stenosis, with x 3 pipeline flow diverter devices.

## 2018-01-02 NOTE — ED Provider Notes (Signed)
[facility] EMERGENCY DEPARTMENT Provider Note   CSN: [id number] Arrival date & time: 01/02/18  1347   An emergency department physician performed an initial assessment on this suspected stroke patient at 1349(Rheagan Nayak).  History   Chief Complaint Chief Complaint  Patient presents with  . Code Stroke    HPI Gary Steele is a 37 y.o. male.  HPI  37 year old male, according to the patient there is no significant prior medical history, the patient states that while he was in a work meeting at the beginning of the meeting he started to feel very abnormal and EMS was alerted as the patient was having difficulty with his right side.  He is complaining of right-sided weakness and numbness, states that he does not feel normal, he cannot use his right arm or right leg like he usually can, he has had difficulty with speech and with his vision.  This all started acutely, is persistent, nothing makes it better or worse, code stroke was activated immediately on the patient's arrival and the neurology team was on arrival at the bedside.  Level 5 caveat applies secondary to the patient's acute illness, his inability to answer questions appropriately and in a timely manner  No past medical history on file.  Patient Active Problem List   Diagnosis Date Noted  . Stroke [facility]) 01/02/2018   Has medical history: Negative Surgical history: Negative Social history: Drinks daily, denies tobacco use Family history: No answer question      Home Medications    Prior to Admission medications   Not on File    Family History No family history on file.  Social History Social History   Tobacco Use  . Smoking status: Not on file  Substance Use Topics  . Alcohol use: Not on file  . Drug use: Not on file     Allergies   Patient has no allergy information on record.   Review of Systems Review of Systems  All other systems reviewed and are negative.    Physical  Exam Updated Vital Signs BP 128/73 Comment: rechecked on IR monitor, new BP 155/99  Pulse (!) 106   Temp 98 F (36.7 C) (Oral)   Resp 19   Wt 126.9 kg   SpO2 97%   Physical Exam  Constitutional: He appears well-developed and well-nourished. He appears distressed.  HENT:  Head: Normocephalic and atraumatic.  Mouth/Throat: Oropharynx is clear and moist. No oropharyngeal exudate.  Eyes: Pupils are equal, round, and reactive to light. Conjunctivae and EOM are normal. Right eye exhibits no discharge. Left eye exhibits no discharge. No scleral icterus.  Neck: Normal range of motion. Neck supple. No JVD present. No thyromegaly present.  Cardiovascular: Normal rate, regular rhythm, normal heart sounds and intact distal pulses. Exam reveals no gallop and no friction rub.  No murmur heard. Pulmonary/Chest: Effort normal and breath sounds normal. No respiratory distress. He has no wheezes. He has no rales.  Abdominal: Soft. Bowel sounds are normal. He exhibits no distension and no mass. There is no tenderness.  Musculoskeletal: Normal range of motion. He exhibits no edema or tenderness.  Lymphadenopathy:    He has no cervical adenopathy.  Neurological: He is alert. Coordination normal.  The patient is unable to lift up his right arm when you ask him to lift it up however when you ask him to tap his fingers of his right hand in rapid alternating movements he is able to lift his hand and arm up enough  to do that.  He can lift his right leg and left leg with equal strength against resistance, there is no facial droop, he appears to have a right visual field cut and will not cross the midline looking to the right.  Speech appears clear though sometimes he appears sleepy, no obvious facial droop, decreased sensation of the right face arm and leg.  Skin: Skin is warm and dry. No rash noted. No erythema.  Psychiatric: He has a normal mood and affect. His behavior is normal.  Nursing note and vitals  reviewed.    ED Treatments / Results  Labs (all labs ordered are listed, but only abnormal results are displayed) Labs Reviewed  COMPREHENSIVE METABOLIC PANEL - Abnormal; Notable for the following components:      Result Value   Potassium 3.1 (*)    Glucose, Bld 120 (*)    All other components within normal limits  CBG MONITORING, ED - Abnormal; Notable for the following components:   Glucose-Capillary 109 (*)    All other components within normal limits  I-STAT CHEM 8, ED - Abnormal; Notable for the following components:   Potassium 3.2 (*)    Glucose, Bld 122 (*)    Calcium, Ion 1.04 (*)    All other components within normal limits  PROTIME-INR  APTT  CBC  DIFFERENTIAL  HIV ANTIBODY (ROUTINE TESTING W REFLEX)  I-STAT TROPONIN, ED    EKG None  Radiology Ct Angio Head W Or Wo Contrast  Result Date: 01/02/2018 CLINICAL DATA:  Right-sided weakness, slurred speech, right homonymous hemianopia, and confusion. EXAM: CT ANGIOGRAPHY HEAD AND NECK CT PERFUSION BRAIN TECHNIQUE: Multidetector CT imaging of the head and neck was performed using the standard protocol during bolus administration of intravenous contrast. Multiplanar CT image reconstructions and MIPs were obtained to evaluate the vascular anatomy. Carotid stenosis measurements (when applicable) are obtained utilizing NASCET criteria, using the distal internal carotid diameter as the denominator. Multiphase CT imaging of the brain was performed following IV bolus contrast injection. Subsequent parametric perfusion maps were calculated using RAPID software. CONTRAST:  ISOVUE-370 IOPAMIDOL (ISOVUE-370) INJECTION 76%; 40mL ISOVUE-370 IOPAMIDOL (ISOVUE-370) INJECTION 76% COMPARISON:  None. FINDINGS: CTA NECK FINDINGS Bolus timing is suboptimal. Aortic arch: Standard 3 vessel aortic arch with widely patent arch vessel origins. Right carotid system: Patent without evidence of stenosis, dissection, or significant atherosclerosis.  Left carotid system: Patent without evidence of stenosis, dissection, or significant atherosclerosis. Vertebral arteries: The vertebral arteries are patent and codominant with a normal appearance on the right. There is an abnormal peripheral, eccentric rim of soft tissue along the left V3 segment with moderate luminal narrowing greatest in the C1 foramen. Skeleton: No acute osseous abnormality or suspicious osseous lesion. Other neck: No evidence of acute abnormality or mass. Upper chest: Clear lung apices. Review of the MIP images confirms the above findings CTA HEAD FINDINGS Suboptimal arterial opacification limits assessment of the small and medium-sized vessels. Anterior circulation: The internal carotid arteries are patent from skull base to carotid termini without evidence of significant stenosis. ACAs and MCAs are patent without evidence of proximal branch occlusion. Assessment for stenosis is limited by image noise creating a diffusely irregular appearance of the vasculature however no convincing flow limiting A1 or M1 stenosis is evident. The right A1 segment is hypoplastic. No aneurysm is identified. Posterior circulation: The intracranial vertebral arteries are patent to the basilar. Patent PICA and SCA origins are identified bilaterally. The basilar artery is patent with mild diffuse irregularity which  is likely artifactual and no evidence of significant stenosis. Both P1 and proximal P2 segments are patent with assessment for stenosis limited by image noise. There is however a left PCA branch vessel occlusion at the P2 bifurcation. No aneurysm is identified. Venous sinuses: As permitted by contrast timing, patent. Anatomic variants: Hypoplastic right A1. Delayed phase: Not performed. Review of the MIP images confirms the above findings CT Brain Perfusion Findings: Perfusion imaging in this case is considered unreliable given the diffusely noisy appearance of the generated perfusion maps, although there  is the suggestion of reduced CBV in the mesial left temporal and occipital lobes. IMPRESSION: 1. Abnormal appearance of the left V3 segment concerning for dissection. 2. Left P2 branch occlusion. 3. Widely patent cervical carotid arteries. 4. Poor quality perfusion imaging with extensive image noise. Initial study review was in person with Dr. Wilford Corner on 01/02/2018 at 2:18 pm with preliminary finding of no large vessel occlusion. 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 Wilford Corner at 2:39 pm. Electronically Signed   By: Sebastian Ache M.D.   On: 01/02/2018 15:00   Ct Angio Neck W Or Wo Contrast  Result Date: 01/02/2018 CLINICAL DATA:  Right-sided weakness, slurred speech, right homonymous hemianopia, and confusion. EXAM: CT ANGIOGRAPHY HEAD AND NECK CT PERFUSION BRAIN TECHNIQUE: Multidetector CT imaging of the head and neck was performed using the standard protocol during bolus administration of intravenous contrast. Multiplanar CT image reconstructions and MIPs were obtained to evaluate the vascular anatomy. Carotid stenosis measurements (when applicable) are obtained utilizing NASCET criteria, using the distal internal carotid diameter as the denominator. Multiphase CT imaging of the brain was performed following IV bolus contrast injection. Subsequent parametric perfusion maps were calculated using RAPID software. CONTRAST:  ISOVUE-370 IOPAMIDOL (ISOVUE-370) INJECTION 76%; 40mL ISOVUE-370 IOPAMIDOL (ISOVUE-370) INJECTION 76% COMPARISON:  None. FINDINGS: CTA NECK FINDINGS Bolus timing is suboptimal. Aortic arch: Standard 3 vessel aortic arch with widely patent arch vessel origins. Right carotid system: Patent without evidence of stenosis, dissection, or significant atherosclerosis. Left carotid system: Patent without evidence of stenosis, dissection, or significant atherosclerosis. Vertebral arteries: The vertebral arteries are  patent and codominant with a normal appearance on the right. There is an abnormal peripheral, eccentric rim of soft tissue along the left V3 segment with moderate luminal narrowing greatest in the C1 foramen. Skeleton: No acute osseous abnormality or suspicious osseous lesion. Other neck: No evidence of acute abnormality or mass. Upper chest: Clear lung apices. Review of the MIP images confirms the above findings CTA HEAD FINDINGS Suboptimal arterial opacification limits assessment of the small and medium-sized vessels. Anterior circulation: The internal carotid arteries are patent from skull base to carotid termini without evidence of significant stenosis. ACAs and MCAs are patent without evidence of proximal branch occlusion. Assessment for stenosis is limited by image noise creating a diffusely irregular appearance of the vasculature however no convincing flow limiting A1 or M1 stenosis is evident. The right A1 segment is hypoplastic. No aneurysm is identified. Posterior circulation: The intracranial vertebral arteries are patent to the basilar. Patent PICA and SCA origins are identified bilaterally. The basilar artery is patent with mild diffuse irregularity which is likely artifactual and no evidence of significant stenosis. Both P1 and proximal P2 segments are patent with assessment for stenosis limited by image noise. There is however a left PCA branch vessel occlusion at the P2 bifurcation. No aneurysm is identified. Venous sinuses: As permitted by contrast  timing, patent. Anatomic variants: Hypoplastic right A1. Delayed phase: Not performed. Review of the MIP images confirms the above findings CT Brain Perfusion Findings: Perfusion imaging in this case is considered unreliable given the diffusely noisy appearance of the generated perfusion maps, although there is the suggestion of reduced CBV in the mesial left temporal and occipital lobes. IMPRESSION: 1. Abnormal appearance of the left V3 segment  concerning for dissection. 2. Left P2 branch occlusion. 3. Widely patent cervical carotid arteries. 4. Poor quality perfusion imaging with extensive image noise. Initial study review was in person with Dr. Wilford Corner on 01/02/2018 at 2:18 pm with preliminary finding of no large vessel occlusion. 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 Wilford Corner at 2:39 pm. Electronically Signed   By: Sebastian Ache M.D.   On: 01/02/2018 15:00   Mr Brain Wo Contrast  Result Date: 01/02/2018 CLINICAL DATA:  Right-sided weakness, slurred speech, right homonymous hemianopia, and confusion. EXAM: MRI HEAD WITHOUT CONTRAST TECHNIQUE: Multiplanar, multiecho pulse sequences of the brain and surrounding structures were obtained without intravenous contrast. COMPARISON:  Head CT, CTA, and cerebral perfusion earlier today FINDINGS: Limited imaging consisting of axial and coronal diffusion and T2 FLAIR sequences were obtained at the direction of the attending stroke neurologist. There is an acute, moderately large left PCA infarct involving the mesial left temporal and occipital lobes. Infarct extends to the lateral aspect of the left thalamus. There is also a 2 cm acute infarct posteriorly in the left cerebral hemisphere, and there is a 5 mm acute infarct in the right pons. No gross intracranial hemorrhage is identified. There is abnormal FLAIR hyperintensity in the PCA at the P2 level with a branch vessel occlusion shown on earlier CTA. Scattered punctate foci of T2 hyperintensity are present in the cerebral white matter bilaterally, mild but abnormal for age. The ventricles and sulci are normal. Mucous retention cysts are noted in the maxillary sinuses. IMPRESSION: 1. Moderately large acute left PCA infarct. 2. Smaller acute infarcts in the left cerebellum and right pons. 3. Mild cerebral white matter T2 signal changes, nonspecific. Considerations include early  chronic small vessel ischemia, headaches, prior infection/inflammation, prior trauma, vasculitis, and less likely demyelinating disease. Electronically Signed   By: Sebastian Ache M.D.   On: 01/02/2018 15:06   Ct Cerebral Perfusion W Contrast  Result Date: 01/02/2018 CLINICAL DATA:  Right-sided weakness, slurred speech, right homonymous hemianopia, and confusion. EXAM: CT ANGIOGRAPHY HEAD AND NECK CT PERFUSION BRAIN TECHNIQUE: Multidetector CT imaging of the head and neck was performed using the standard protocol during bolus administration of intravenous contrast. Multiplanar CT image reconstructions and MIPs were obtained to evaluate the vascular anatomy. Carotid stenosis measurements (when applicable) are obtained utilizing NASCET criteria, using the distal internal carotid diameter as the denominator. Multiphase CT imaging of the brain was performed following IV bolus contrast injection. Subsequent parametric perfusion maps were calculated using RAPID software. CONTRAST:  ISOVUE-370 IOPAMIDOL (ISOVUE-370) INJECTION 76%; 40mL ISOVUE-370 IOPAMIDOL (ISOVUE-370) INJECTION 76% COMPARISON:  None. FINDINGS: CTA NECK FINDINGS Bolus timing is suboptimal. Aortic arch: Standard 3 vessel aortic arch with widely patent arch vessel origins. Right carotid system: Patent without evidence of stenosis, dissection, or significant atherosclerosis. Left carotid system: Patent without evidence of stenosis, dissection, or significant atherosclerosis. Vertebral arteries: The vertebral arteries are patent and codominant with a normal appearance on the right. There is an abnormal peripheral, eccentric rim of soft tissue along the left V3  segment with moderate luminal narrowing greatest in the C1 foramen. Skeleton: No acute osseous abnormality or suspicious osseous lesion. Other neck: No evidence of acute abnormality or mass. Upper chest: Clear lung apices. Review of the MIP images confirms the above findings CTA HEAD FINDINGS  Suboptimal arterial opacification limits assessment of the small and medium-sized vessels. Anterior circulation: The internal carotid arteries are patent from skull base to carotid termini without evidence of significant stenosis. ACAs and MCAs are patent without evidence of proximal branch occlusion. Assessment for stenosis is limited by image noise creating a diffusely irregular appearance of the vasculature however no convincing flow limiting A1 or M1 stenosis is evident. The right A1 segment is hypoplastic. No aneurysm is identified. Posterior circulation: The intracranial vertebral arteries are patent to the basilar. Patent PICA and SCA origins are identified bilaterally. The basilar artery is patent with mild diffuse irregularity which is likely artifactual and no evidence of significant stenosis. Both P1 and proximal P2 segments are patent with assessment for stenosis limited by image noise. There is however a left PCA branch vessel occlusion at the P2 bifurcation. No aneurysm is identified. Venous sinuses: As permitted by contrast timing, patent. Anatomic variants: Hypoplastic right A1. Delayed phase: Not performed. Review of the MIP images confirms the above findings CT Brain Perfusion Findings: Perfusion imaging in this case is considered unreliable given the diffusely noisy appearance of the generated perfusion maps, although there is the suggestion of reduced CBV in the mesial left temporal and occipital lobes. IMPRESSION: 1. Abnormal appearance of the left V3 segment concerning for dissection. 2. Left P2 branch occlusion. 3. Widely patent cervical carotid arteries. 4. Poor quality perfusion imaging with extensive image noise. Initial study review was in person with Dr. Wilford Corner on 01/02/2018 at 2:18 pm with preliminary finding of no large vessel occlusion. 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 Wilford Corner at 2:39 pm.  Electronically Signed   By: Sebastian Ache M.D.   On: 01/02/2018 15:00   Ct Head Code Stroke Wo Contrast  Addendum Date: 01/02/2018   ADDENDUM REPORT: 01/02/2018 15:12 ADDENDUM: On further review, there is cortical hypoattenuation in the mesial left temporal lobe and occipital lobe which is consistent with an acute PCA infarct and confirmed on subsequent MRI. Hyperdense focus in the left ambient cistern likely reflects an acute P2 embolus. Electronically Signed   By: Sebastian Ache M.D.   On: 01/02/2018 15:12   Result Date: 01/02/2018 CLINICAL DATA:  Code stroke. Right-sided weakness, slurred speech, right homonymous hemianopia, and confusion. EXAM: CT HEAD WITHOUT CONTRAST TECHNIQUE: Contiguous axial images were obtained from the base of the skull through the vertex without intravenous contrast. COMPARISON:  None. FINDINGS: Brain: There is no evidence of acute infarct, intracranial hemorrhage, mass, midline shift, or extra-axial fluid collection. The ventricles and sulci are normal. Vascular: No suspicious vessel hyperdensity. Skull: No fracture or focal osseous lesion. Sinuses/Orbits: Small bilateral maxillary sinus mucous retention cysts. Clear mastoid air cells. Unremarkable orbits. Other: None. ASPECTS [facility] Stroke Program Early CT Score) - Ganglionic level infarction (caudate, lentiform nuclei, internal capsule, insula, M1-M3 cortex): 7 - Supraganglionic infarction (M4-M6 cortex): 3 Total score (0-10 with 10 being normal): 10 IMPRESSION: 1. Unremarkable CT appearance of the brain. 2. ASPECTS is 10. The study was reviewed in person with Dr. Wilford Corner on 01/02/2018 at 2:18 p.m. Electronically Signed: By: Sebastian Ache M.D. On: 01/02/2018 14:30    Procedures .Critical Care Performed  by: Eber Hong, MD Authorized by: Eber Hong, MD   Critical care provider statement:    Critical care time (minutes):  35   Critical care time was exclusive of:  Separately billable procedures and treating other  patients and teaching time   Critical care was necessary to treat or prevent imminent or life-threatening deterioration of the following conditions:  CNS failure or compromise   Critical care was time spent personally by me on the following activities:  Blood draw for specimens, development of treatment plan with patient or surrogate, discussions with consultants, evaluation of patient's response to treatment, examination of patient, obtaining history from patient or surrogate, ordering and performing treatments and interventions, ordering and review of laboratory studies, ordering and review of radiographic studies, pulse oximetry, re-evaluation of patient's condition and review of old charts   (including critical care time)  Medications Ordered in ED Medications  alteplase (ACTIVASE) 1 mg/mL infusion 90 mg (90 mg Intravenous New Bag/Given 01/02/18 1451)    Followed by  0.9 %  sodium chloride infusion (has no administration in time range)  labetalol (NORMODYNE,TRANDATE) injection 20 mg (has no administration in time range)    And  nicardipine (CARDENE) 20mg  in 0.86% saline IV infusion (0.1 mg/ml) (has no administration in time range)  tirofiban (AGGRASTAT) 5-0.9 MG/100ML-% injection (has no administration in time range)  ticagrelor (BRILINTA) 90 MG tablet (has no administration in time range)  aspirin 325 MG tablet (has no administration in time range)  clopidogrel (PLAVIX) 300 MG tablet (has no administration in time range)  lidocaine (XYLOCAINE) 1 % (with pres) injection (has no administration in time range)  nitroGLYCERIN 100 mcg/mL intra-arterial injection (has no administration in time range)  eptifibatide (INTEGRILIN) 20 MG/10ML injection (has no administration in time range)  lidocaine (XYLOCAINE) 1 % (with pres) injection (has no administration in time range)  ceFAZolin (ANCEF) 2-4 GM/100ML-% IVPB (has no administration in time range)   stroke: mapping our early stages of  recovery book (has no administration in time range)  0.9 %  sodium chloride infusion (has no administration in time range)  acetaminophen (TYLENOL) tablet 650 mg (has no administration in time range)    Or  acetaminophen (TYLENOL) solution 650 mg (has no administration in time range)    Or  acetaminophen (TYLENOL) suppository 650 mg (has no administration in time range)  senna-docusate (Senokot-S) tablet 1 tablet (has no administration in time range)  famotidine (PEPCID) IVPB 20 mg premix (has no administration in time range)  iopamidol (ISOVUE-370) 76 % injection 100 mL (100 mLs Intravenous Contrast Given 01/02/18 1422)  iopamidol (ISOVUE-370) 76 % injection 100 mL (40 mLs Intravenous Contrast Given 01/02/18 1428)  fentaNYL (SUBLIMAZE) 100 MCG/2ML injection (  Override pull for Anesthesia 01/02/18 1511)  fentaNYL (SUBLIMAZE) 100 MCG/2ML injection (  Override pull for Anesthesia 01/02/18 1511)     Initial Impression / Assessment and Plan / ED Course  I have reviewed the triage vital signs and the nursing notes.  Pertinent labs & imaging results that were available during my care of the patient were reviewed by me and considered in my medical decision making (see chart for details).     The patient's exam is very abnormal, the neurology team has seen the patient, ordered the CT scan, CT angiograms and now an MRI to further define whether this patient has a true stroke.  At this time the patient is getting the lab work-up, EKG and cardiac monitoring.  Ultimately the patient had  a CT scan, CT angiogram and ultimately an MRI, the MRI showed that he was having an acute posterior communicating artery infarct, this was fairly large territory for that region, he was taken to the interventional radiology suite for intervention and will go to the neuro ICU.  The patient is critically ill with an acute ischemic stroke.  Of note the patient was hypertensive at 168/100 [place] afebrile  Vitals:   01/02/18  1300 01/02/18 1347 01/02/18 1450 01/02/18 1500  BP:  (!) 168/102 (!) 138/101 128/73  Pulse:  100 (!) 101 (!) 106  Resp:  20 20 19   Temp:  98 F (36.7 C) 98 F (36.7 C)   TempSrc:  Oral Oral   SpO2:   97% 97%  Weight: 126.9 kg        Final Clinical Impressions(s) / ED Diagnoses   Final diagnoses:  Acute ischemic stroke (HCC)      Eber Hong, MD 01/02/18 1534

## 2018-01-02 NOTE — ED Notes (Signed)
Patient's belonging given to IR staff

## 2018-01-02 NOTE — Progress Notes (Signed)
Dr Eustace Quail has entered IR suite 2.

## 2018-01-02 NOTE — ED Notes (Signed)
Pt transported to MRI 

## 2018-01-02 NOTE — Progress Notes (Signed)
Patient ID: Gary Steele, male   DOB: 08/29/1980, 37 y.o.   MRN: [id number] INR. 89 Y M RH. LSW 100pm. New onset of blurred vision,,slurred speech,and Rt hemianopsia. Associated rt sided weakness. CT brain No ICH .ASPECTS 10 Rapid CP . CBF < 30 % vol 0 ml . 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 Endovascular revascularization of occluded Lt PCA P1 seg discussed with patient. Procedure,risks,reasons,alternatives reviewed. Risks of ICH 10 %,worsening neuro deficit,vent dependency,death ,inability to revascularize,vascular injury reviewd. Patient expressed understanding and consented to the treatment under GA.Patients spouse was also spoken to by Dr.Arora. Informed witnessed consent was obtained. [name] MD

## 2018-01-03 ENCOUNTER — Encounter (HOSPITAL_COMMUNITY): Payer: Self-pay | Admitting: Interventional Radiology

## 2018-01-03 ENCOUNTER — Inpatient Hospital Stay (HOSPITAL_COMMUNITY): Payer: BLUE CROSS/BLUE SHIELD

## 2018-01-03 ENCOUNTER — Other Ambulatory Visit (HOSPITAL_COMMUNITY): Payer: Self-pay

## 2018-01-03 DIAGNOSIS — I63532 Cerebral infarction due to unspecified occlusion or stenosis of left posterior cerebral artery: Principal | ICD-10-CM

## 2018-01-03 DIAGNOSIS — I7774 Dissection of vertebral artery: Secondary | ICD-10-CM

## 2018-01-03 DIAGNOSIS — I63212 Cerebral infarction due to unspecified occlusion or stenosis of left vertebral arteries: Secondary | ICD-10-CM

## 2018-01-03 DIAGNOSIS — E782 Mixed hyperlipidemia: Secondary | ICD-10-CM

## 2018-01-03 LAB — CBC
HCT: 39.5 % (ref 39.0–52.0)
Hemoglobin: 13.1 g/dL (ref 13.0–17.0)
MCH: 30 pg (ref 26.0–34.0)
MCHC: 33.2 g/dL (ref 30.0–36.0)
MCV: 90.6 fL (ref 80.0–100.0)
PLATELETS: 345 10*3/uL (ref 150–400)
RBC: 4.36 MIL/uL (ref 4.22–5.81)
RDW: 12.4 % (ref 11.5–15.5)
WBC: 9.5 10*3/uL (ref 4.0–10.5)
nRBC: 0 % (ref 0.0–0.2)

## 2018-01-03 LAB — DIFFERENTIAL
Basophils Absolute: 0.1 10*3/uL (ref 0.0–0.1)
Basophils Relative: 0 %
EOS ABS: 0 10*3/uL (ref 0.0–0.5)
EOS PCT: 0 %
Lymphocytes Relative: 16 %
Lymphs Abs: 1.8 10*3/uL (ref 0.7–4.0)
Monocytes Absolute: 0.9 10*3/uL (ref 0.1–1.0)
Monocytes Relative: 8 %
NEUTROS PCT: 75 %
Neutro Abs: 8.3 10*3/uL — ABNORMAL HIGH (ref 1.7–7.7)

## 2018-01-03 LAB — HEMOGLOBIN A1C
HEMOGLOBIN A1C: 5.2 % (ref 4.8–5.6)
MEAN PLASMA GLUCOSE: 102.54 mg/dL

## 2018-01-03 LAB — LIPID PANEL
Cholesterol: 177 mg/dL (ref 0–200)
HDL: 26 mg/dL — AB (ref >40)
LDL CALC: UNDETERMINED mg/dL (ref 0–99)
Triglycerides: 1868 mg/dL — ABNORMAL HIGH (ref <150)
VLDL: UNDETERMINED mg/dL (ref 0–40)

## 2018-01-03 LAB — BASIC METABOLIC PANEL
ANION GAP: 10 (ref 5–15)
BUN: 7 mg/dL (ref 6–20)
CALCIUM: 8.2 mg/dL — AB (ref 8.9–10.3)
CO2: 20 mmol/L — ABNORMAL LOW (ref 22–32)
Chloride: 105 mmol/L (ref 98–111)
Creatinine, Ser: 0.43 mg/dL — ABNORMAL LOW (ref 0.61–1.24)
GFR calc Af Amer: >60 mL/min (ref >60)
GLUCOSE: 110 mg/dL — AB (ref 70–99)
Potassium: 4.9 mmol/L (ref 3.5–5.1)
Sodium: 135 mmol/L (ref 135–145)

## 2018-01-03 LAB — ECHOCARDIOGRAM COMPLETE: Weight: 4476.22 oz

## 2018-01-03 MED ORDER — FOLIC ACID 1 MG PO TABS
1.0000 mg | ORAL_TABLET | Freq: Every day | ORAL | Status: DC
  Administered 2018-01-03 – 2018-01-05 (×3): 1 mg via ORAL
  Filled 2018-01-03 (×3): qty 1

## 2018-01-03 MED ORDER — VITAMIN B-1 100 MG PO TABS
100.0000 mg | ORAL_TABLET | Freq: Every day | ORAL | Status: DC
  Administered 2018-01-03 – 2018-01-05 (×3): 100 mg via ORAL
  Filled 2018-01-03 (×3): qty 1

## 2018-01-03 MED ORDER — LABETALOL HCL 5 MG/ML IV SOLN: 10.0000 mg | INTRAVENOUS | Status: DC | PRN

## 2018-01-03 MED ORDER — PANTOPRAZOLE SODIUM 40 MG PO TBEC
40.0000 mg | DELAYED_RELEASE_TABLET | Freq: Every day | ORAL | Status: DC
  Administered 2018-01-04 – 2018-01-05 (×2): 40 mg via ORAL
  Filled 2018-01-03 (×2): qty 1

## 2018-01-03 MED ORDER — ONDANSETRON HCL 4 MG/2ML IJ SOLN
4.0000 mg | Freq: Four times a day (QID) | INTRAMUSCULAR | Status: DC | PRN
  Administered 2018-01-03: 4 mg via INTRAVENOUS

## 2018-01-03 MED ORDER — FENOFIBRATE 160 MG PO TABS
160.0000 mg | ORAL_TABLET | Freq: Every day | ORAL | Status: DC
  Administered 2018-01-03: 160 mg via ORAL
  Filled 2018-01-03 (×2): qty 1

## 2018-01-03 MED ORDER — PROSIGHT PO TABS
1.0000 | ORAL_TABLET | Freq: Every day | ORAL | Status: DC
  Administered 2018-01-03 – 2018-01-05 (×3): 1 via ORAL
  Filled 2018-01-03 (×3): qty 1

## 2018-01-03 MED ORDER — ATORVASTATIN CALCIUM 80 MG PO TABS
80.0000 mg | ORAL_TABLET | Freq: Every day | ORAL | Status: DC
  Administered 2018-01-03 – 2018-01-04 (×2): 80 mg via ORAL
  Filled 2018-01-03 (×2): qty 1

## 2018-01-03 MED ORDER — INFLUENZA VAC SPLIT QUAD 0.5 ML IM SUSY
0.5000 mL | PREFILLED_SYRINGE | INTRAMUSCULAR | Status: AC
  Administered 2018-01-04: 0.5 mL via INTRAMUSCULAR
  Filled 2018-01-03: qty 0.5

## 2018-01-03 MED ORDER — ONDANSETRON HCL 4 MG/2ML IJ SOLN
INTRAMUSCULAR | Status: AC
  Filled 2018-01-03: qty 2

## 2018-01-03 NOTE — Progress Notes (Signed)
Occupational Therapy Evaluation Patient Details Name: Gary Steele MRN: [id number] DOB: 07-Sep-1980 Today's Date: 01/03/2018    History of Present Illness 37 y.o. male, who is visiting [place] from out of town for a work meeting and began having R sided weakness, numbness, difficulty with speech and blurred vision. 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 Left vertebral artery V2, V3, and V4 segment acute dissection s/p reconstruction using #3 pipeline flow diverter devices 01/02/2018. Left vertebral artery V2, V3, and V4 segment acute dissection s/p reconstruction using #3 pipeline flow diverter devices 01/02/2018    Clinical Impression   PTA, pt was independent with ADL and mobility, lived in [place] with his wife and 36 yo son and works for the [employer]. Pt presents with R sides impaired sensation in addition to R visual field deficits. Pt with R upper quadrant loss, however, he also demonstrates difficulty with his central vision regarding his ability to read. Mobility limited due to nausea/vomiting. Pt will most likely be able to DC home with S and follow up with OT at a neuro outpt center. Recommend pt follow up with his eye doctor to have a full field visual assessment. Will follow acutely.     Follow Up Recommendations  Outpatient OT;Supervision - Intermittent    Equipment Recommendations  3 in 1 bedside commode    Recommendations for Other Services       Precautions / Restrictions Precautions Precautions: Fall      Mobility Bed Mobility Overal bed mobility: Needs Assistance Bed Mobility: Supine to Sit;Sit to Supine     Supine to sit: Min assist Sit to supine: Min guard      Transfers Overall transfer level: Needs assistance   Transfers: Stand Pivot Transfers;Sit to/from Stand Sit to Stand: Min assist         General transfer comment: Pt reports feeling unsteady;  difficulty feeling RLE    Balance Overall balance assessment: Needs assistance   Sitting balance-Leahy Scale: Good       Standing balance-Leahy Scale: Poor Standing balance comment: reliant on external support                           ADL either performed or assessed with clinical judgement   ADL Overall ADL's : Needs assistance/impaired     Grooming: Set up;Sitting   Upper Body Bathing: Set up;Sitting   Lower Body Bathing: Minimal assistance;Sit to/from stand   Upper Body Dressing : Minimal assistance;Sitting   Lower Body Dressing: Moderate assistance;Sit to/from stand Lower Body Dressing Details (indicate cue type and reason): lmited by groin pain       Toileting - Clothing Manipulation Details (indicate cue type and reason): foley     Functional mobility during ADLs: Minimal assistance General ADL Comments: mobility limited by nausea     Vision Baseline Vision/History: Wears glasses Wears Glasses: At all times Patient Visual Report: Blurring of vision Vision Assessment?: Yes Eye Alignment: Within Functional Limits Ocular Range of Motion: Within Functional Limits Alignment/Gaze Preference: Within Defined Limits Tracking/Visual Pursuits: Decreased smoothness of horizontal tracking;Decreased smoothness of vertical tracking Saccades: Additional eye shifts occurred during testing Convergence: Within functional limits Visual Fields: Right superior homonymous quadranopsia Additional Comments: Difficulty reading/seeing his phone - this is different from baseline; asked family to increase font size     Perception Perception Comments: [facility]   Praxis Praxis Praxis tested?: Within functional limits  Pertinent Vitals/Pain Pain Assessment: 0-10 Pain Score: 8  Pain Location: R groin with mobility Pain Descriptors / Indicators: Burning Pain Intervention(s): Limited activity within patient's tolerance;Repositioned     Hand Dominance Right    Extremity/Trunk Assessment Upper Extremity Assessment Upper Extremity Assessment: RUE deficits/detail RUE Deficits / Details: strength WFL; sensation impaired - will further assess RUE Sensation: decreased light touch RUE Coordination: decreased fine motor   Lower Extremity Assessment Lower Extremity Assessment: Defer to PT evaluation(impaired sensation)   Cervical / Trunk Assessment Cervical / Trunk Assessment: Normal   Communication Communication Communication: No difficulties   Cognition Arousal/Alertness: Awake/alert Behavior During Therapy: WFL for tasks assessed/performed Overall Cognitive Status: Impaired/Different from baseline Area of Impairment: Awareness                           Awareness: Emergent   General Comments: Most likely due to not sleeping; distracted by pain; unable to give information regarding STE; will further assess   General Comments       Exercises     Shoulder Instructions      Home Living Family/patient expects to be discharged to:: Private residence Living Arrangements: Spouse/significant other Available Help at Discharge: Family;Available 24 hours/day Type of Home: House Home Access: Stairs to enter [employer] of Steps: 3(from drive  then an additional step into house) Entrance Stairs-Rails: Right Home Layout: One level     Bathroom Shower/Tub: IT trainer: Standard Bathroom Accessibility: Yes How Accessible: Accessible via walker Home Equipment: None          Prior Functioning/Environment Level of Independence: Independent        Comments: drives; works at [employer]        OT Problem List: Decreased activity tolerance;Impaired balance (sitting and/or standing);Impaired vision/perception;Decreased coordination;Decreased knowledge of use of DME or AE;Impaired sensation;Impaired UE functional use;Pain      OT Treatment/Interventions: Self-care/ADL  training;Neuromuscular education;Therapeutic exercise;DME and/or AE instruction;Therapeutic activities;Visual/perceptual remediation/compensation;Patient/family education;Balance training    OT Goals(Current goals can be found in the care plan section) Acute Rehab OT Goals Patient Stated Goal: to get back to work OT Goal Formulation: With patient/family Time For Goal Achievement: 01/17/18 Potential to Achieve Goals: Good  OT Frequency: Min 3X/week   Barriers to D/C:            Co-evaluation              AM-PAC PT "6 Clicks" Daily Activity     Outcome Measure Help from another person eating meals?: None Help from another person taking care of personal grooming?: None Help from another person toileting, which includes using toliet, bedpan, or urinal?: A Little Help from another person bathing (including washing, rinsing, drying)?: A Little Help from another person to put on and taking off regular upper body clothing?: A Little Help from another person to put on and taking off regular lower body clothing?: A Little 6 Click Score: 20   End of Session Equipment Utilized During Treatment: Gait belt Nurse Communication: Mobility status;Other (comment)(pt nauseated)  Activity Tolerance: Other (comment)(limited by nausea) Patient left: in bed;with call bell/phone within reach;with family/visitor present;with SCD's reapplied  OT Visit Diagnosis: Unsteadiness on feet (R26.81);Low vision, both eyes (H54.2);Pain Pain - Right/Left: Right Pain - part of body: Leg(groin)                Time: 1110-1145 OT Time Calculation (min): 35 min Charges:  OT General Charges $OT  Visit: 1 Visit OT Evaluation $OT Eval Moderate Complexity: 1 Mod OT Treatments $Self Care/Home Management : 8-22 mins  Luisa Dago, OT/L   Acute OT Clinical Specialist Acute Rehabilitation Services Pager [phone] Office [phone]   [facility] 01/03/2018, 12:50 PM

## 2018-01-03 NOTE — Progress Notes (Signed)
OT Cancellation Note  Patient Details Name: Milos Milligan MRN: [id number] DOB: 01-28-1981   Cancelled Treatment:    Reason Eval/Treat Not Completed: Active bedrest order  [facility] Kevyn Wengert, OT/L   Acute OT Clinical Specialist Acute Rehabilitation Services Pager [phone] Office [phone]  01/03/2018, 8:40 AM

## 2018-01-03 NOTE — Progress Notes (Signed)
  Echocardiogram 2D Echocardiogram was attempted but patient was in MRI.   Gary Steele 01/03/2018, 2:39 PM

## 2018-01-03 NOTE — Evaluation (Signed)
Physical Therapy Evaluation Patient Details Name: Gary Steele MRN: [id number] DOB: [birth year] Today's Date: 01/03/2018   History of Present Illness  37 y.o. male, who is visiting [employer] from out of town for a work meeting and began having R sided weakness, numbness, difficulty with speech and blurred vision. Suspected stroke s/p IV tPA.  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  Pt with no other significant PMH.    Clinical Impression  Pt is limited this PM by significant HA.  He was able to sit EOB and stand taking some side steps EOB with two person hand held assist.  He has most significant deficits in vision and sensation ( RUE and R LE).   He will likely progress well enough to d/c home with his wife's assist to OP PT setting in his area.  PT will continue to follow acutely for safe mobility progression   Follow Up Recommendations Outpatient PT;Supervision for mobility/OOB    Equipment Recommendations  None recommended by PT    Recommendations for Other Services   NA     Precautions / Restrictions Precautions Precautions: Fall      Mobility  Bed Mobility Overal bed mobility: Needs Assistance Bed Mobility: Supine to Sit;Sit to Supine     Supine to sit: Min assist Sit to supine: Min guard   General bed mobility comments: Min to min guard assist for safety during transitions.   Transfers Overall transfer level: Needs assistance Equipment used: 2 person hand held assist Transfers: Sit to/from Stand Sit to Stand: Min assist;+2 physical assistance         General transfer comment: Two person min hand held assist to stand at [facility], wife providing second hand.   Ambulation/Gait Ambulation/Gait assistance: Min assist;+2 physical assistance Gait Distance (Feet): 3 Feet Assistive device: 2 person hand held assist Gait Pattern/deviations: Step-to  pattern     General Gait Details: Pt was able to side step EOB with two person min hand held assist without signs of buckling.  HA to great to get up OOB right now, wants to rest, but wife agreeable to call staff to get him up OOB later this evening.        Modified Rankin (Stroke Patients Only) Modified Rankin (Stroke Patients Only) Pre-Morbid Rankin Score: No symptoms Modified Rankin: Moderately severe disability     Balance Overall balance assessment: Needs assistance   Sitting balance-Leahy Scale: Good       Standing balance-Leahy Scale: Poor Standing balance comment: reliant on external support                             Pertinent Vitals/Pain Pain Assessment: 0-10 Pain Score: 8  Pain Location: Head and foley catheter site Pain Descriptors / Indicators: Burning;Aching Pain Intervention(s): Monitored during session;Limited activity within patient's tolerance;Repositioned    Home Living Family/patient expects to be discharged to:: Private residence Living Arrangements: Spouse/significant other;Children(7 y.o. son (2nd grade)) Available Help at Discharge: Family;Available 24 hours/day Type of Home: House Home Access: Stairs to enter Entrance Stairs-Rails: Right Entrance Stairs-Number of Steps: 3(from drive  then an additional step into house) Home Layout: One level Home Equipment: None      Prior Function Level of Independence: Independent         Comments: drives; works at [employer]  Dominant Hand: Right    Extremity/Trunk Assessment   Upper Extremity Assessment Upper Extremity Assessment: Defer to OT evaluation    Lower Extremity Assessment Lower Extremity Assessment: RLE deficits/detail RLE Deficits / Details: gross bed level MMT WNL, functionally pt self reports it feels weaker, but it did not buckle or snap back into hyperextension in standing.  This is complicated by decreased sensation on R  LE.  RLE Sensation: decreased light touch RLE Coordination: WNL(seated Heel to shin test)    Cervical / Trunk Assessment Cervical / Trunk Assessment: Normal  Communication   Communication: No difficulties  Cognition Arousal/Alertness: Awake/alert Behavior During Therapy: WFL for tasks assessed/performed Overall Cognitive Status: Within Functional Limits for tasks assessed                                 General Comments: Not specificially tested, but awareness issues from earlier seem better.              Assessment/Plan    PT Assessment Patient needs continued PT services  PT Problem List Decreased strength;Decreased activity tolerance;Decreased balance;Decreased mobility;Decreased coordination;Decreased knowledge of use of DME;Decreased knowledge of precautions;Obesity;Impaired sensation;Pain       PT Treatment Interventions DME instruction;Gait training;Stair training;Functional mobility training;Therapeutic activities;Therapeutic exercise;Balance training;Neuromuscular re-education;Patient/family education    PT Goals (Current goals can be found in the Care Plan section)  Acute Rehab PT Goals Patient Stated Goal: to get back to work, get back home PT Goal Formulation: With patient/family Time For Goal Achievement: 01/17/18 Potential to Achieve Goals: Good    Frequency Min 4X/week           AM-PAC PT "6 Clicks" Daily Activity  Outcome Measure Difficulty turning over in bed (including adjusting bedclothes, sheets and blankets)?: Unable Difficulty moving from lying on back to sitting on the side of the bed? : Unable Difficulty sitting down on and standing up from a chair with arms (e.g., wheelchair, bedside commode, etc,.)?: Unable Help needed moving to and from a bed to chair (including a wheelchair)?: A Little Help needed walking in hospital room?: A Little Help needed climbing 3-5 steps with a railing? : A Lot 6 Click Score: 11    End of Session  Equipment Utilized During Treatment: Gait belt Activity Tolerance: Patient limited by pain;Patient limited by fatigue Patient left: in bed;with call bell/phone within reach;with family/visitor present Nurse Communication: Mobility status PT Visit Diagnosis: Muscle weakness (generalized) (M62.81);Difficulty in walking, not elsewhere classified (R26.2);Pain;Hemiplegia and hemiparesis Hemiplegia - Right/Left: Right Hemiplegia - dominant/non-dominant: Dominant Hemiplegia - caused by: Cerebral infarction Pain - Right/Left: (head) Pain - part of body: (head)    Time: 1610-9604 PT Time Calculation (min) (ACUTE ONLY): 29 min   Charges:          Lurena Joiner B. Biannca Scantlin, PT, DPT  Acute Rehabilitation [phone] pager #[phone] office   PT Evaluation $PT Eval Low Complexity: 1 Low PT Treatments $Therapeutic Activity: 8-22 mins        01/03/2018, 5:39 PM

## 2018-01-03 NOTE — Progress Notes (Signed)
SLP Cancellation Note  Patient Details Name: Gary Steele MRN: [id number] DOB: 1980-11-14   Cancelled treatment:       Reason Eval/Treat Not Completed: Fatigue/lethargy limiting ability to participate.  Pt sleeping soundly upon arrival.  Pain and fatigue noted to be limiting during OT evaluation and pt's wife who was at bedside reports that pt did not sleep at all last night.  I feel that we could get a better picture of pt's cognitive-linguistic function if we return when pt is fully awake.  Therefore, will hold off on cognitive-linguistic evaluation at this time.    Jayant Kriz, Melanee Spry 01/03/2018, 1:18 PM

## 2018-01-03 NOTE — Progress Notes (Signed)
  Echocardiogram 2D Echocardiogram has been performed.  Pieter Partridge 01/03/2018, 4:05 PM

## 2018-01-03 NOTE — Plan of Care (Signed)
Stroke book given, successfully was able to get up out of bed. Adequate appetite and nutrition. Pt and family member verbalize understanding of education about disease process and care plan.

## 2018-01-03 NOTE — Progress Notes (Addendum)
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 Corliss Skains. Left vertebral artery V2, V3, and V4 segment acute dissection s/p reconstruction using #3 pipeline flow diverter devices 01/02/2018 with Dr. Corliss Skains. Patient awake and alert laying in bed. Accompanied by multiple family members at bedside. States he feels "out of it today". Reassured him this is probably related to the general anesthesia he received yesterday. Complains of blurred vision. States he is not wearing his contacts, but it is worse than normal (as in worse than when he usually does not wear his contacts). Complains of right-sided weakness/numbness. States he can move his right side but it is weaker in his arm/leg. Denies headache, numbness/tingling, dizziness, diplopia, hearing changes, tinnitus, or speech difficulty. Right groin incision c/d/i.   Allergies: Patient has no known allergies.  Medications: Prior to Admission medications   Medication Sig Start Date End Date Taking? Authorizing Provider  fluticasone (FLONASE) 50 MCG/ACT nasal spray Place 2 sprays into both nostrils daily.   Yes [provider]     Vital Signs: BP 97/60   Pulse 90   Temp 98.3 F (36.8 C) (Oral)   Resp 19   Wt 279 lb 12.2 oz (126.9 kg)   SpO2 95%   Physical Exam  Constitutional: He appears well-developed and well-nourished. No distress.  Pulmonary/Chest: Effort normal. No respiratory distress.  Neurological:  Alert, awake, and oriented x3. Speech and comprehension intact. PERRL bilaterally. EOMs bilaterally without nystagmus or subjective diplopia. Visual fields not assessed. No facial asymmetry. Tongue midline. Can spontaneously move  all extremities. No pronator drift. Fine motor and coordination intact and symmetric. Gait not assessed. Romberg not assessed. Heel to toe not assessed. Distal pulses 2+ bilaterally.  Skin: Skin is warm and dry.  Right groin incision soft without active bleeding or hematoma.  Psychiatric: He has a normal mood and affect. His behavior is normal. Judgment and thought content normal.  Nursing note and vitals reviewed.   Imaging: Ct Angio Head W Or Wo Contrast  Result Date: 01/02/2018 CLINICAL DATA:  Right-sided weakness, slurred speech, right homonymous hemianopia, and confusion. EXAM: CT ANGIOGRAPHY HEAD AND NECK CT PERFUSION BRAIN TECHNIQUE: Multidetector CT imaging of the head and neck was performed using the standard protocol during bolus administration of intravenous contrast. Multiplanar CT image reconstructions and MIPs were obtained to evaluate the vascular anatomy. Carotid stenosis measurements (when applicable) are obtained utilizing NASCET criteria, using the distal internal carotid diameter as the denominator. Multiphase CT imaging of the brain was performed following IV bolus contrast injection. Subsequent parametric perfusion maps were calculated using RAPID software. CONTRAST:  ISOVUE-370 IOPAMIDOL (ISOVUE-370) INJECTION 76%; 40mL ISOVUE-370 IOPAMIDOL (ISOVUE-370) INJECTION 76% COMPARISON:  None. FINDINGS: CTA NECK FINDINGS Bolus timing is suboptimal. Aortic arch: Standard 3 vessel aortic arch with widely patent arch vessel origins. Right carotid system: Patent without evidence of stenosis, dissection, or significant atherosclerosis. Left carotid system: Patent without evidence of stenosis, dissection, or significant atherosclerosis. Vertebral arteries: The vertebral arteries are patent and codominant with a normal appearance on the right. There is an abnormal peripheral, eccentric rim of soft tissue along the left V3 segment with moderate luminal narrowing greatest in the C1  foramen. Skeleton: No acute osseous abnormality or suspicious osseous lesion. Other neck: No evidence of acute abnormality or mass. Upper  chest: Clear lung apices. Review of the MIP images confirms the above findings CTA HEAD FINDINGS Suboptimal arterial opacification limits assessment of the small and medium-sized vessels. Anterior circulation: The internal carotid arteries are patent from skull base to carotid termini without evidence of significant stenosis. ACAs and MCAs are patent without evidence of proximal branch occlusion. Assessment for stenosis is limited by image noise creating a diffusely irregular appearance of the vasculature however no convincing flow limiting A1 or M1 stenosis is evident. The right A1 segment is hypoplastic. No aneurysm is identified. Posterior circulation: The intracranial vertebral arteries are patent to the basilar. Patent PICA and SCA origins are identified bilaterally. The basilar artery is patent with mild diffuse irregularity which is likely artifactual and no evidence of significant stenosis. Both P1 and proximal P2 segments are patent with assessment for stenosis limited by image noise. There is however a left PCA branch vessel occlusion at the P2 bifurcation. No aneurysm is identified. Venous sinuses: As permitted by contrast timing, patent. Anatomic variants: Hypoplastic right A1. Delayed phase: Not performed. Review of the MIP images confirms the above findings CT Brain Perfusion Findings: Perfusion imaging in this case is considered unreliable given the diffusely noisy appearance of the generated perfusion maps, although there is the suggestion of reduced CBV in the mesial left temporal and occipital lobes. IMPRESSION: 1. Abnormal appearance of the left V3 segment concerning for dissection. 2. Left P2 branch occlusion. 3. Widely patent cervical carotid arteries. 4. Poor quality perfusion imaging with extensive image noise. Initial study review was in person with Dr.  Wilford Corner on 01/02/2018 at 2:18 pm with preliminary finding of no large vessel occlusion. 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 Wilford Corner at 2:39 pm. Electronically Signed   By: Sebastian Ache M.D.   On: 01/02/2018 15:00   Ct Angio Neck W Or Wo Contrast  Result Date: 01/02/2018 CLINICAL DATA:  Right-sided weakness, slurred speech, right homonymous hemianopia, and confusion. EXAM: CT ANGIOGRAPHY HEAD AND NECK CT PERFUSION BRAIN TECHNIQUE: Multidetector CT imaging of the head and neck was performed using the standard protocol during bolus administration of intravenous contrast. Multiplanar CT image reconstructions and MIPs were obtained to evaluate the vascular anatomy. Carotid stenosis measurements (when applicable) are obtained utilizing NASCET criteria, using the distal internal carotid diameter as the denominator. Multiphase CT imaging of the brain was performed following IV bolus contrast injection. Subsequent parametric perfusion maps were calculated using RAPID software. CONTRAST:  ISOVUE-370 IOPAMIDOL (ISOVUE-370) INJECTION 76%; 40mL ISOVUE-370 IOPAMIDOL (ISOVUE-370) INJECTION 76% COMPARISON:  None. FINDINGS: CTA NECK FINDINGS Bolus timing is suboptimal. Aortic arch: Standard 3 vessel aortic arch with widely patent arch vessel origins. Right carotid system: Patent without evidence of stenosis, dissection, or significant atherosclerosis. Left carotid system: Patent without evidence of stenosis, dissection, or significant atherosclerosis. Vertebral arteries: The vertebral arteries are patent and codominant with a normal appearance on the right. There is an abnormal peripheral, eccentric rim of soft tissue along the left V3 segment with moderate luminal narrowing greatest in the C1 foramen. Skeleton: No acute osseous abnormality or suspicious osseous lesion. Other neck: No evidence of acute abnormality or mass. Upper chest:  Clear lung apices. Review of the MIP images confirms the above findings CTA HEAD FINDINGS Suboptimal arterial opacification limits assessment of the small and medium-sized vessels. Anterior circulation: The internal carotid arteries are patent from skull base to carotid termini without evidence of significant stenosis. ACAs and  MCAs are patent without evidence of proximal branch occlusion. Assessment for stenosis is limited by image noise creating a diffusely irregular appearance of the vasculature however no convincing flow limiting A1 or M1 stenosis is evident. The right A1 segment is hypoplastic. No aneurysm is identified. Posterior circulation: The intracranial vertebral arteries are patent to the basilar. Patent PICA and SCA origins are identified bilaterally. The basilar artery is patent with mild diffuse irregularity which is likely artifactual and no evidence of significant stenosis. Both P1 and proximal P2 segments are patent with assessment for stenosis limited by image noise. There is however a left PCA branch vessel occlusion at the P2 bifurcation. No aneurysm is identified. Venous sinuses: As permitted by contrast timing, patent. Anatomic variants: Hypoplastic right A1. Delayed phase: Not performed. Review of the MIP images confirms the above findings CT Brain Perfusion Findings: Perfusion imaging in this case is considered unreliable given the diffusely noisy appearance of the generated perfusion maps, although there is the suggestion of reduced CBV in the mesial left temporal and occipital lobes. IMPRESSION: 1. Abnormal appearance of the left V3 segment concerning for dissection. 2. Left P2 branch occlusion. 3. Widely patent cervical carotid arteries. 4. Poor quality perfusion imaging with extensive image noise. Initial study review was in person with Dr. Wilford Corner on 01/02/2018 at 2:18 pm with preliminary finding of no large vessel occlusion. Additional findings of suspected left vertebral artery  dissection and left PCA branch occlusion were called by telephone to Dr. Wilford Corner at 2:39 pm. Electronically Signed   By: Sebastian Ache M.D.   On: 01/02/2018 15:00   Mr Brain Wo Contrast  Result Date: 01/02/2018 CLINICAL DATA:  Right-sided weakness, slurred speech, right homonymous hemianopia, and confusion. EXAM: MRI HEAD WITHOUT CONTRAST TECHNIQUE: Multiplanar, multiecho pulse sequences of the brain and surrounding structures were obtained without intravenous contrast. COMPARISON:  Head CT, CTA, and cerebral perfusion earlier today FINDINGS: Limited imaging consisting of axial and coronal diffusion and T2 FLAIR sequences were obtained at the direction of the attending stroke neurologist. There is an acute, moderately large left PCA infarct involving the mesial left temporal and occipital lobes. Infarct extends to the lateral aspect of the left thalamus. There is also a 2 cm acute infarct posteriorly in the left cerebral hemisphere, and there is a 5 mm acute infarct in the right pons. No gross intracranial hemorrhage is identified. There is abnormal FLAIR hyperintensity in the PCA at the P2 level with a branch vessel occlusion shown on earlier CTA. Scattered punctate foci of T2 hyperintensity are present in the cerebral white matter bilaterally, mild but abnormal for age. The ventricles and sulci are normal. Mucous retention cysts are noted in the maxillary sinuses. IMPRESSION: 1. Moderately large acute left PCA infarct. 2. Smaller acute infarcts in the left cerebellum and right pons. 3. Mild cerebral white matter T2 signal changes, nonspecific. Considerations include early chronic small vessel ischemia, headaches, prior infection/inflammation, prior trauma, vasculitis, and less likely demyelinating disease. Electronically Signed   By: Sebastian Ache M.D.   On: 01/02/2018 15:06   Ct Cerebral Perfusion W Contrast  Result Date: 01/02/2018 CLINICAL DATA:  Right-sided weakness, slurred speech, right homonymous  hemianopia, and confusion. EXAM: CT ANGIOGRAPHY HEAD AND NECK CT PERFUSION BRAIN TECHNIQUE: Multidetector CT imaging of the head and neck was performed using the standard protocol during bolus administration of intravenous contrast. Multiplanar CT image reconstructions and MIPs were obtained to evaluate the vascular anatomy. Carotid stenosis measurements (when applicable) are obtained utilizing NASCET criteria,  using the distal internal carotid diameter as the denominator. Multiphase CT imaging of the brain was performed following IV bolus contrast injection. Subsequent parametric perfusion maps were calculated using RAPID software. CONTRAST:  ISOVUE-370 IOPAMIDOL (ISOVUE-370) INJECTION 76%; 40mL ISOVUE-370 IOPAMIDOL (ISOVUE-370) INJECTION 76% COMPARISON:  None. FINDINGS: CTA NECK FINDINGS Bolus timing is suboptimal. Aortic arch: Standard 3 vessel aortic arch with widely patent arch vessel origins. Right carotid system: Patent without evidence of stenosis, dissection, or significant atherosclerosis. Left carotid system: Patent without evidence of stenosis, dissection, or significant atherosclerosis. Vertebral arteries: The vertebral arteries are patent and codominant with a normal appearance on the right. There is an abnormal peripheral, eccentric rim of soft tissue along the left V3 segment with moderate luminal narrowing greatest in the C1 foramen. Skeleton: No acute osseous abnormality or suspicious osseous lesion. Other neck: No evidence of acute abnormality or mass. Upper chest: Clear lung apices. Review of the MIP images confirms the above findings CTA HEAD FINDINGS Suboptimal arterial opacification limits assessment of the small and medium-sized vessels. Anterior circulation: The internal carotid arteries are patent from skull base to carotid termini without evidence of significant stenosis. ACAs and MCAs are patent without evidence of proximal branch occlusion. Assessment for stenosis is limited by  image noise creating a diffusely irregular appearance of the vasculature however no convincing flow limiting A1 or M1 stenosis is evident. The right A1 segment is hypoplastic. No aneurysm is identified. Posterior circulation: The intracranial vertebral arteries are patent to the basilar. Patent PICA and SCA origins are identified bilaterally. The basilar artery is patent with mild diffuse irregularity which is likely artifactual and no evidence of significant stenosis. Both P1 and proximal P2 segments are patent with assessment for stenosis limited by image noise. There is however a left PCA branch vessel occlusion at the P2 bifurcation. No aneurysm is identified. Venous sinuses: As permitted by contrast timing, patent. Anatomic variants: Hypoplastic right A1. Delayed phase: Not performed. Review of the MIP images confirms the above findings CT Brain Perfusion Findings: Perfusion imaging in this case is considered unreliable given the diffusely noisy appearance of the generated perfusion maps, although there is the suggestion of reduced CBV in the mesial left temporal and occipital lobes. IMPRESSION: 1. Abnormal appearance of the left V3 segment concerning for dissection. 2. Left P2 branch occlusion. 3. Widely patent cervical carotid arteries. 4. Poor quality perfusion imaging with extensive image noise. Initial study review was in person with Dr. Wilford Corner on 01/02/2018 at 2:18 pm with preliminary finding of no large vessel occlusion. 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 Wilford Corner at 2:39 pm. Electronically Signed   By: Sebastian Ache M.D.   On: 01/02/2018 15:00   Ct Head Code Stroke Wo Contrast  Addendum Date: 01/02/2018   ADDENDUM REPORT: 01/02/2018 15:12 ADDENDUM: On further review, there is cortical hypoattenuation in the mesial left temporal lobe and occipital lobe which is consistent with an acute PCA infarct and confirmed on  subsequent MRI. Hyperdense focus in the left ambient cistern likely reflects an acute P2 embolus. Electronically Signed   By: Sebastian Ache M.D.   On: 01/02/2018 15:12   Result Date: 01/02/2018 CLINICAL DATA:  Code stroke. Right-sided weakness, slurred speech, right homonymous hemianopia, and confusion. EXAM: CT HEAD WITHOUT CONTRAST TECHNIQUE: Contiguous axial images were obtained from the base of the skull through the vertex without intravenous contrast. COMPARISON:  None. FINDINGS: Brain: There is no evidence  of acute infarct, intracranial hemorrhage, mass, midline shift, or extra-axial fluid collection. The ventricles and sulci are normal. Vascular: No suspicious vessel hyperdensity. Skull: No fracture or focal osseous lesion. Sinuses/Orbits: Small bilateral maxillary sinus mucous retention cysts. Clear mastoid air cells. Unremarkable orbits. Other: None. ASPECTS [facility] Stroke Program Early CT Score) - Ganglionic level infarction (caudate, lentiform nuclei, internal capsule, insula, M1-M3 cortex): 7 - Supraganglionic infarction (M4-M6 cortex): 3 Total score (0-10 with 10 being normal): 10 IMPRESSION: 1. Unremarkable CT appearance of the brain. 2. ASPECTS is 10. The study was reviewed in person with Dr. Wilford Corner on 01/02/2018 at 2:18 p.m. Electronically Signed: By: Sebastian Ache M.D. On: 01/02/2018 14:30    Labs:  CBC: Recent Labs    01/02/18 1350 01/02/18 1356  WBC 8.7  --   HGB 15.6 15.6  HCT 46.1 46.0  PLT 242  --     COAGS: Recent Labs    01/02/18 1350  INR 1.04  APTT 25    BMP: Recent Labs    01/02/18 1350 01/02/18 1356 01/03/18 0500  NA 140 140 135  K 3.1* 3.2* 4.9  CL 104 105 105  CO2 22  --  20*  GLUCOSE 120* 122* 110*  BUN 12 14 7   CALCIUM 9.1  --  8.2*  CREATININE 0.85 0.80 0.43*  GFRNONAA >60  --  >60  GFRAA >60  --  >60    LIVER FUNCTION TESTS: Recent Labs    01/02/18 1350  BILITOT 1.0  AST 27  ALT 34  ALKPHOS 58  PROT 7.2  ALBUMIN 4.2    Assessment  and Plan:  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 Corliss Skains. Left vertebral artery V2, V3, and V4 segment acute dissection s/p reconstruction using #3 pipeline flow diverter devices 01/02/2018 with Dr. Corliss Skains. Patient's condition improving- can move all extremities but still experiencing blurred vision. Right groin incision stable. Continue taking Brilinta 90 mg twice daily and Aspirin 81 mg once daily. Plan for MRI/MRA brain/head and CT head today. Appreciate and agree with neurology management. Plan to follow-up in clinic with Dr. Corliss Skains 3 months after discharge. IR to follow.   Electronically Signed: Elwin Mocha, PA-C 01/03/2018, 9:34 AM   I spent a total of 15 Minutes at the the patient's bedside AND on the patient's hospital floor or unit, greater than 50% of which was counseling/coordinating care for left PCA P1 segment occlusion s/p revascularization AND left VA V2, V3, and V4 segments acute dissection s/p reconstruction.

## 2018-01-03 NOTE — Progress Notes (Signed)
CRITICAL VALUE ALERT  Critical Value:  Triglycerides 1800  Date & Time Notied:  01/02/2018 0854  Provider Notified: MD Roda Shutters  Orders Received/Actions taken: MD made aware, no orders added at this time.

## 2018-01-03 NOTE — Progress Notes (Signed)
STROKE TEAM PROGRESS NOTE   SUBJECTIVE (INTERVAL HISTORY) His sister is at the bedside.  Overall he feels his condition is gradually improving. He still has right upper quadrantanopia, and right UE and LE numbness. He had IR yesterday with TICI3 left PCA with left vertebral stenting.    OBJECTIVE Temp:  [97.5 F (36.4 C)-98.7 F (37.1 C)] 97.9 F (36.6 C) (10/09 0800) Pulse Rate:  [85-107] 87 (10/09 0800) Cardiac Rhythm: Normal sinus rhythm (10/09 0800) Resp:  [12-23] 18 (10/09 0800) BP: (82-168)/(49-102) 115/69 (10/09 0800) SpO2:  [92 %-100 %] 96 % (10/09 0800) Arterial Line BP: (108-164)/(58-88) 130/76 (10/09 0800) Weight:  [126.9 kg] 126.9 kg (10/08 1300)  Recent Labs  Lab 01/02/18 1349  GLUCAP 109*   Recent Labs  Lab 01/02/18 1350 01/02/18 1356 01/03/18 0500  NA 140 140 135  K 3.1* 3.2* 4.9  CL 104 105 105  CO2 22  --  20*  GLUCOSE 120* 122* 110*  BUN 12 14 7   CREATININE 0.85 0.80 0.43*  CALCIUM 9.1  --  8.2*   Recent Labs  Lab 01/02/18 1350  AST 27  ALT 34  ALKPHOS 58  BILITOT 1.0  PROT 7.2  ALBUMIN 4.2   Recent Labs  Lab 01/02/18 1350 01/02/18 1356 01/03/18 0500  WBC 8.7  --  9.5  NEUTROABS 5.6  --  8.3*  HGB 15.6 15.6 13.1  HCT 46.1 46.0 39.5  MCV 91.1  --  90.6  PLT 242  --  345   No results for input(s): CKTOTAL, CKMB, CKMBINDEX, TROPONINI in the last 168 hours. Recent Labs    01/02/18 1350  LABPROT 13.5  INR 1.04   No results for input(s): COLORURINE, LABSPEC, PHURINE, GLUCOSEU, HGBUR, BILIRUBINUR, KETONESUR, PROTEINUR, UROBILINOGEN, NITRITE, LEUKOCYTESUR in the last 72 hours.  Invalid input(s): APPERANCEUR     Component Value Date/Time   CHOL 177 01/03/2018 0500   TRIG 1,868 (H) 01/03/2018 0500   HDL 26 (L) 01/03/2018 0500   CHOLHDL NOT REPORTED DUE TO HIGH TRIGLYCERIDES 01/03/2018 0500   VLDL UNABLE TO CALCULATE IF TRIGLYCERIDE OVER 400 mg/dL 40/98/1191 4782   LDLCALC UNABLE TO CALCULATE IF TRIGLYCERIDE OVER 400 mg/dL 95/62/1308  6578   No results found for: HGBA1C    Component Value Date/Time   LABOPIA NONE DETECTED 01/02/2018 2013   COCAINSCRNUR NONE DETECTED 01/02/2018 2013   LABBENZ NONE DETECTED 01/02/2018 2013   AMPHETMU NONE DETECTED 01/02/2018 2013   THCU NONE DETECTED 01/02/2018 2013   LABBARB NONE DETECTED 01/02/2018 2013    No results for input(s): ETH in the last 168 hours.  I have personally reviewed the radiological images below and agree with the radiology interpretations.  Ct Angio Head W Or Wo Contrast  Result Date: 01/02/2018 CLINICAL DATA:  Right-sided weakness, slurred speech, right homonymous hemianopia, and confusion. EXAM: CT ANGIOGRAPHY HEAD AND NECK CT PERFUSION BRAIN TECHNIQUE: Multidetector CT imaging of the head and neck was performed using the standard protocol during bolus administration of intravenous contrast. Multiplanar CT image reconstructions and MIPs were obtained to evaluate the vascular anatomy. Carotid stenosis measurements (when applicable) are obtained utilizing NASCET criteria, using the distal internal carotid diameter as the denominator. Multiphase CT imaging of the brain was performed following IV bolus contrast injection. Subsequent parametric perfusion maps were calculated using RAPID software. CONTRAST:  ISOVUE-370 IOPAMIDOL (ISOVUE-370) INJECTION 76%; 40mL ISOVUE-370 IOPAMIDOL (ISOVUE-370) INJECTION 76% COMPARISON:  None. FINDINGS: CTA NECK FINDINGS Bolus timing is suboptimal. Aortic arch: Standard 3 vessel aortic arch  with widely patent arch vessel origins. Right carotid system: Patent without evidence of stenosis, dissection, or significant atherosclerosis. Left carotid system: Patent without evidence of stenosis, dissection, or significant atherosclerosis. Vertebral arteries: The vertebral arteries are patent and codominant with a normal appearance on the right. There is an abnormal peripheral, eccentric rim of soft tissue along the left V3 segment with moderate  luminal narrowing greatest in the C1 foramen. Skeleton: No acute osseous abnormality or suspicious osseous lesion. Other neck: No evidence of acute abnormality or mass. Upper chest: Clear lung apices. Review of the MIP images confirms the above findings CTA HEAD FINDINGS Suboptimal arterial opacification limits assessment of the small and medium-sized vessels. Anterior circulation: The internal carotid arteries are patent from skull base to carotid termini without evidence of significant stenosis. ACAs and MCAs are patent without evidence of proximal branch occlusion. Assessment for stenosis is limited by image noise creating a diffusely irregular appearance of the vasculature however no convincing flow limiting A1 or M1 stenosis is evident. The right A1 segment is hypoplastic. No aneurysm is identified. Posterior circulation: The intracranial vertebral arteries are patent to the basilar. Patent PICA and SCA origins are identified bilaterally. The basilar artery is patent with mild diffuse irregularity which is likely artifactual and no evidence of significant stenosis. Both P1 and proximal P2 segments are patent with assessment for stenosis limited by image noise. There is however a left PCA branch vessel occlusion at the P2 bifurcation. No aneurysm is identified. Venous sinuses: As permitted by contrast timing, patent. Anatomic variants: Hypoplastic right A1. Delayed phase: Not performed. Review of the MIP images confirms the above findings CT Brain Perfusion Findings: Perfusion imaging in this case is considered unreliable given the diffusely noisy appearance of the generated perfusion maps, although there is the suggestion of reduced CBV in the mesial left temporal and occipital lobes. IMPRESSION: 1. Abnormal appearance of the left V3 segment concerning for dissection. 2. Left P2 branch occlusion. 3. Widely patent cervical carotid arteries. 4. Poor quality perfusion imaging with extensive image noise. Initial  study review was in person with Dr. Wilford Corner on 01/02/2018 at 2:18 pm with preliminary finding of no large vessel occlusion. 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 Wilford Corner at 2:39 pm. Electronically Signed   By: Sebastian Ache M.D.   On: 01/02/2018 15:00   Ct Angio Neck W Or Wo Contrast  Result Date: 01/02/2018 CLINICAL DATA:  Right-sided weakness, slurred speech, right homonymous hemianopia, and confusion. EXAM: CT ANGIOGRAPHY HEAD AND NECK CT PERFUSION BRAIN TECHNIQUE: Multidetector CT imaging of the head and neck was performed using the standard protocol during bolus administration of intravenous contrast. Multiplanar CT image reconstructions and MIPs were obtained to evaluate the vascular anatomy. Carotid stenosis measurements (when applicable) are obtained utilizing NASCET criteria, using the distal internal carotid diameter as the denominator. Multiphase CT imaging of the brain was performed following IV bolus contrast injection. Subsequent parametric perfusion maps were calculated using RAPID software. CONTRAST:  ISOVUE-370 IOPAMIDOL (ISOVUE-370) INJECTION 76%; 40mL ISOVUE-370 IOPAMIDOL (ISOVUE-370) INJECTION 76% COMPARISON:  None. FINDINGS: CTA NECK FINDINGS Bolus timing is suboptimal. Aortic arch: Standard 3 vessel aortic arch with widely patent arch vessel origins. Right carotid system: Patent without evidence of stenosis, dissection, or significant atherosclerosis. Left carotid system: Patent without evidence of stenosis, dissection, or significant atherosclerosis. Vertebral arteries: The vertebral arteries are patent and codominant with a normal appearance on the right. There is  an abnormal peripheral, eccentric rim of soft tissue along the left V3 segment with moderate luminal narrowing greatest in the C1 foramen. Skeleton: No acute osseous abnormality or suspicious osseous lesion. Other neck: No evidence of  acute abnormality or mass. Upper chest: Clear lung apices. Review of the MIP images confirms the above findings CTA HEAD FINDINGS Suboptimal arterial opacification limits assessment of the small and medium-sized vessels. Anterior circulation: The internal carotid arteries are patent from skull base to carotid termini without evidence of significant stenosis. ACAs and MCAs are patent without evidence of proximal branch occlusion. Assessment for stenosis is limited by image noise creating a diffusely irregular appearance of the vasculature however no convincing flow limiting A1 or M1 stenosis is evident. The right A1 segment is hypoplastic. No aneurysm is identified. Posterior circulation: The intracranial vertebral arteries are patent to the basilar. Patent PICA and SCA origins are identified bilaterally. The basilar artery is patent with mild diffuse irregularity which is likely artifactual and no evidence of significant stenosis. Both P1 and proximal P2 segments are patent with assessment for stenosis limited by image noise. There is however a left PCA branch vessel occlusion at the P2 bifurcation. No aneurysm is identified. Venous sinuses: As permitted by contrast timing, patent. Anatomic variants: Hypoplastic right A1. Delayed phase: Not performed. Review of the MIP images confirms the above findings CT Brain Perfusion Findings: Perfusion imaging in this case is considered unreliable given the diffusely noisy appearance of the generated perfusion maps, although there is the suggestion of reduced CBV in the mesial left temporal and occipital lobes. IMPRESSION: 1. Abnormal appearance of the left V3 segment concerning for dissection. 2. Left P2 branch occlusion. 3. Widely patent cervical carotid arteries. 4. Poor quality perfusion imaging with extensive image noise. Initial study review was in person with Dr. Wilford Corner on 01/02/2018 at 2:18 pm with preliminary finding of no large vessel occlusion. 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 Wilford Corner at 2:39 pm. Electronically Signed   By: Sebastian Ache M.D.   On: 01/02/2018 15:00   Mr Brain Wo Contrast  Result Date: 01/02/2018 CLINICAL DATA:  Right-sided weakness, slurred speech, right homonymous hemianopia, and confusion. EXAM: MRI HEAD WITHOUT CONTRAST TECHNIQUE: Multiplanar, multiecho pulse sequences of the brain and surrounding structures were obtained without intravenous contrast. COMPARISON:  Head CT, CTA, and cerebral perfusion earlier today FINDINGS: Limited imaging consisting of axial and coronal diffusion and T2 FLAIR sequences were obtained at the direction of the attending stroke neurologist. There is an acute, moderately large left PCA infarct involving the mesial left temporal and occipital lobes. Infarct extends to the lateral aspect of the left thalamus. There is also a 2 cm acute infarct posteriorly in the left cerebral hemisphere, and there is a 5 mm acute infarct in the right pons. No gross intracranial hemorrhage is identified. There is abnormal FLAIR hyperintensity in the PCA at the P2 level with a branch vessel occlusion shown on earlier CTA. Scattered punctate foci of T2 hyperintensity are present in the cerebral white matter bilaterally, mild but abnormal for age. The ventricles and sulci are normal. Mucous retention cysts are noted in the maxillary sinuses. IMPRESSION: 1. Moderately large acute left PCA infarct. 2. Smaller acute infarcts in the left cerebellum and right pons. 3. Mild cerebral white matter T2 signal changes, nonspecific. Considerations include early chronic small vessel ischemia, headaches, prior infection/inflammation, prior trauma, vasculitis, and less likely demyelinating disease. Electronically  Signed   By: Sebastian Ache M.D.   On: 01/02/2018 15:06   Ct Cerebral Perfusion W Contrast  Result Date: 01/02/2018 CLINICAL DATA:  Right-sided weakness, slurred  speech, right homonymous hemianopia, and confusion. EXAM: CT ANGIOGRAPHY HEAD AND NECK CT PERFUSION BRAIN TECHNIQUE: Multidetector CT imaging of the head and neck was performed using the standard protocol during bolus administration of intravenous contrast. Multiplanar CT image reconstructions and MIPs were obtained to evaluate the vascular anatomy. Carotid stenosis measurements (when applicable) are obtained utilizing NASCET criteria, using the distal internal carotid diameter as the denominator. Multiphase CT imaging of the brain was performed following IV bolus contrast injection. Subsequent parametric perfusion maps were calculated using RAPID software. CONTRAST:  ISOVUE-370 IOPAMIDOL (ISOVUE-370) INJECTION 76%; 40mL ISOVUE-370 IOPAMIDOL (ISOVUE-370) INJECTION 76% COMPARISON:  None. FINDINGS: CTA NECK FINDINGS Bolus timing is suboptimal. Aortic arch: Standard 3 vessel aortic arch with widely patent arch vessel origins. Right carotid system: Patent without evidence of stenosis, dissection, or significant atherosclerosis. Left carotid system: Patent without evidence of stenosis, dissection, or significant atherosclerosis. Vertebral arteries: The vertebral arteries are patent and codominant with a normal appearance on the right. There is an abnormal peripheral, eccentric rim of soft tissue along the left V3 segment with moderate luminal narrowing greatest in the C1 foramen. Skeleton: No acute osseous abnormality or suspicious osseous lesion. Other neck: No evidence of acute abnormality or mass. Upper chest: Clear lung apices. Review of the MIP images confirms the above findings CTA HEAD FINDINGS Suboptimal arterial opacification limits assessment of the small and medium-sized vessels. Anterior circulation: The internal carotid arteries are patent from skull base to carotid termini without evidence of significant stenosis. ACAs and MCAs are patent without evidence of proximal branch occlusion. Assessment for  stenosis is limited by image noise creating a diffusely irregular appearance of the vasculature however no convincing flow limiting A1 or M1 stenosis is evident. The right A1 segment is hypoplastic. No aneurysm is identified. Posterior circulation: The intracranial vertebral arteries are patent to the basilar. Patent PICA and SCA origins are identified bilaterally. The basilar artery is patent with mild diffuse irregularity which is likely artifactual and no evidence of significant stenosis. Both P1 and proximal P2 segments are patent with assessment for stenosis limited by image noise. There is however a left PCA branch vessel occlusion at the P2 bifurcation. No aneurysm is identified. Venous sinuses: As permitted by contrast timing, patent. Anatomic variants: Hypoplastic right A1. Delayed phase: Not performed. Review of the MIP images confirms the above findings CT Brain Perfusion Findings: Perfusion imaging in this case is considered unreliable given the diffusely noisy appearance of the generated perfusion maps, although there is the suggestion of reduced CBV in the mesial left temporal and occipital lobes. IMPRESSION: 1. Abnormal appearance of the left V3 segment concerning for dissection. 2. Left P2 branch occlusion. 3. Widely patent cervical carotid arteries. 4. Poor quality perfusion imaging with extensive image noise. Initial study review was in person with Dr. Wilford Corner on 01/02/2018 at 2:18 pm with preliminary finding of no large vessel occlusion. 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 Wilford Corner at 2:39 pm. Electronically Signed   By: Sebastian Ache M.D.   On: 01/02/2018 15:00   Ct Head Code Stroke Wo Contrast  Addendum Date: 01/02/2018   ADDENDUM REPORT: 01/02/2018 15:12 ADDENDUM: On further review, there is cortical hypoattenuation in the mesial left temporal lobe and occipital lobe which is  consistent with an acute PCA  infarct and confirmed on subsequent MRI. Hyperdense focus in the left ambient cistern likely reflects an acute P2 embolus. Electronically Signed   By: Sebastian Ache M.D.   On: 01/02/2018 15:12   Result Date: 01/02/2018 CLINICAL DATA:  Code stroke. Right-sided weakness, slurred speech, right homonymous hemianopia, and confusion. EXAM: CT HEAD WITHOUT CONTRAST TECHNIQUE: Contiguous axial images were obtained from the base of the skull through the vertex without intravenous contrast. COMPARISON:  None. FINDINGS: Brain: There is no evidence of acute infarct, intracranial hemorrhage, mass, midline shift, or extra-axial fluid collection. The ventricles and sulci are normal. Vascular: No suspicious vessel hyperdensity. Skull: No fracture or focal osseous lesion. Sinuses/Orbits: Small bilateral maxillary sinus mucous retention cysts. Clear mastoid air cells. Unremarkable orbits. Other: None. ASPECTS [facility] Stroke Program Early CT Score) - Ganglionic level infarction (caudate, lentiform nuclei, internal capsule, insula, M1-M3 cortex): 7 - Supraganglionic infarction (M4-M6 cortex): 3 Total score (0-10 with 10 being normal): 10 IMPRESSION: 1. Unremarkable CT appearance of the brain. 2. ASPECTS is 10. The study was reviewed in person with Dr. Wilford Corner on 01/02/2018 at 2:18 p.m. Electronically Signed: By: Sebastian Ache M.D. On: 01/02/2018 14:30   MRI and MRA pending  TTE pending   PHYSICAL EXAM  Temp:  [97.5 F (36.4 C)-98.7 F (37.1 C)] 97.9 F (36.6 C) (10/09 0800) Pulse Rate:  [85-107] 87 (10/09 0800) Resp:  [12-23] 18 (10/09 0800) BP: (82-168)/(49-102) 115/69 (10/09 0800) SpO2:  [92 %-100 %] 96 % (10/09 0800) Arterial Line BP: (108-164)/(58-88) 130/76 (10/09 0800) Weight:  [126.9 kg] 126.9 kg (10/08 1300)  General - Well nourished, well developed, in no apparent distress.  Ophthalmologic - fundi not visualized due to noncooperation.  Cardiovascular - Regular rate and rhythm.  Mental Status -  Level  of arousal and orientation to time, place, and person were intact. Language including expression, naming, repetition, comprehension was assessed and found intact. Attention span and concentration were normal. Fund of Knowledge was assessed and was intact.  Cranial Nerves II - XII - II - Visual field right upper quadrantanopia. III, IV, VI - Extraocular movements intact. V - Facial sensation intact bilaterally. VII - Facial movement intact bilaterally. VIII - Hearing & vestibular intact bilaterally. X - Palate elevates symmetrically. XI - Chin turning & shoulder shrug intact bilaterally. XII - Tongue protrusion intact.  Motor Strength - The patient's strength was normal in all extremities and pronator drift was absent.  Bulk was normal and fasciculations were absent.   Motor Tone - Muscle tone was assessed at the neck and appendages and was normal.  Reflexes - The patient's reflexes were symmetrical in all extremities and he had no pathological reflexes.  Sensory - Light touch, temperature/pinprick were assessed and were decreased on the right, with 70-80% of the left.    Coordination - The patient had normal movements in the hands with no ataxia or dysmetria.  Tremor was absent.  Gait and Station - deferred.   ASSESSMENT/PLAN Mr. Berle Fitz is a 37 y.o. male with history of HTN admitted for blurry vision, slurry speech, right hemianopia. tPA given.    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  MRI -  left PCA moderate, left PICA small and right pontine small infarcts  MRI repeat pending  MRA  pending  2D Echo  pending  LDL NTC due to high TG 1868  HgbA1c 5.2  UDS  neg  SCDs for VTE prophylaxis  No antithrombotic prior to admission, now on aspirin 81 mg daily and brilinta  Patient counseled to be compliant with his antithrombotic medications  Ongoing aggressive stroke risk factor management  Therapy recommendations:  pending  Disposition:  Pending  Left VA dissection  Denies head trauma, neck manipulation or abrupt movement  S/p pipeline stenting  On ASA and brilinta and high dose lipitor  Not sure if related to high TG and HTN  Hypertension . Stable . Permissive hypertension (OK if <180/105) for 24-48 hours post stroke and then gradually normalized within 5-7 days. . Taper off cleviprex  Long term BP goal normotensive  Hyperlipidemia, likely familial   Home meds:  none   LDL NTC due to high TG 1868, goal < 70  TG too high, doubt due to cleviprex, likely familial   Now on high dose lipitor and fenofibrate  Continue statin and feno at discharge  Other Stroke Risk Factors  ETOH use - 3-4 drinks of alcohol every night - on FA/MVI/B1  Other Active Problems    Hospital day # 1  This patient is critically ill due to stroke s/p tPA and thrombectomy, HLD, left VA dissection s/p stenting and at significant risk of neurological worsening, death form bleeding, ICH, recurrent stroke, VA occlusion/re-stenosis. This patient's care requires constant monitoring of vital signs, hemodynamics, respiratory and cardiac monitoring, review of multiple databases, neurological assessment, discussion with family, other specialists and medical decision making of high complexity. I spent 40 minutes of neurocritical care time in the care of this patient. I had long discussion with pt and his family members at bedside, updated pt current condition, treatment plan and potential prognosis. They expressed understanding and appreciation.    Marvel Plan, MD PhD Stroke Neurology 01/03/2018 10:03 AM    To contact Stroke Continuity provider, please refer  to [email or web address]. After hours, contact General Neurology

## 2018-01-04 ENCOUNTER — Inpatient Hospital Stay (HOSPITAL_COMMUNITY): Payer: BLUE CROSS/BLUE SHIELD

## 2018-01-04 ENCOUNTER — Encounter (HOSPITAL_COMMUNITY): Payer: Self-pay | Admitting: Interventional Radiology

## 2018-01-04 DIAGNOSIS — M7989 Other specified soft tissue disorders: Secondary | ICD-10-CM | POA: Diagnosis not present

## 2018-01-04 DIAGNOSIS — I10 Essential (primary) hypertension: Secondary | ICD-10-CM | POA: Diagnosis present

## 2018-01-04 DIAGNOSIS — R609 Edema, unspecified: Secondary | ICD-10-CM

## 2018-01-04 DIAGNOSIS — I6340 Cerebral infarction due to embolism of unspecified cerebral artery: Secondary | ICD-10-CM

## 2018-01-04 DIAGNOSIS — Q208 Other congenital malformations of cardiac chambers and connections: Secondary | ICD-10-CM

## 2018-01-04 DIAGNOSIS — I7774 Dissection of vertebral artery: Secondary | ICD-10-CM | POA: Diagnosis present

## 2018-01-04 DIAGNOSIS — E785 Hyperlipidemia, unspecified: Secondary | ICD-10-CM

## 2018-01-04 LAB — LIPID PANEL
Cholesterol: 141 mg/dL (ref 0–200)
HDL: 40 mg/dL — ABNORMAL LOW (ref >40)
LDL CALC: 71 mg/dL (ref 0–99)
Total CHOL/HDL Ratio: 3.5 RATIO
Triglycerides: 148 mg/dL (ref <150)
VLDL: 30 mg/dL (ref 0–40)

## 2018-01-04 LAB — BASIC METABOLIC PANEL
Anion gap: 8 (ref 5–15)
BUN: 7 mg/dL (ref 6–20)
CALCIUM: 8.7 mg/dL — AB (ref 8.9–10.3)
CO2: 23 mmol/L (ref 22–32)
CREATININE: 0.79 mg/dL (ref 0.61–1.24)
Chloride: 109 mmol/L (ref 98–111)
GFR calc Af Amer: >60 mL/min (ref >60)
GLUCOSE: 106 mg/dL — AB (ref 70–99)
Potassium: 3.6 mmol/L (ref 3.5–5.1)
Sodium: 140 mmol/L (ref 135–145)

## 2018-01-04 LAB — HIV ANTIBODY (ROUTINE TESTING W REFLEX): HIV Screen 4th Generation wRfx: NONREACTIVE

## 2018-01-04 MED ORDER — SODIUM CHLORIDE 0.9 % IV SOLN
INTRAVENOUS | Status: DC
  Administered 2018-01-04: 22:00:00 via INTRAVENOUS

## 2018-01-04 MED ORDER — BUTALBITAL-APAP-CAFFEINE 50-325-40 MG PO TABS
1.0000 | ORAL_TABLET | Freq: Three times a day (TID) | ORAL | Status: DC | PRN
  Administered 2018-01-04 – 2018-01-05 (×3): 1 via ORAL
  Filled 2018-01-04 (×3): qty 1

## 2018-01-04 MED ORDER — TICAGRELOR 90 MG PO TABS
90.0000 mg | ORAL_TABLET | Freq: Two times a day (BID) | ORAL | Status: DC
  Administered 2018-01-04 – 2018-01-05 (×3): 90 mg via ORAL
  Filled 2018-01-04 (×3): qty 1

## 2018-01-04 MED ORDER — ACETAMINOPHEN 325 MG PO TABS: 650.0000 mg | ORAL_TABLET | Freq: Four times a day (QID) | ORAL | Status: DC | PRN

## 2018-01-04 NOTE — Progress Notes (Signed)
Physical Therapy Treatment Patient Details Name: Gary Steele MRN: [id number] DOB: 04-15-1980 Today's Date: 01/04/2018    History of Present Illness 37 y.o. male, who is visiting [place] from out of town for a work meeting and began having R sided weakness, numbness, difficulty with speech and blurred vision. Suspected stroke s/p IV tPA.  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  Pt with no other significant PMH.      PT Comments    Patient with improved tolerance to mobility this session despite remaining headache.  He demonstrates fall risk with DGI 19/24 due to slower gait speed, changes in gait with head turns and imbalance briefly on stairs using rail to rebalance.  Feel he remains appropriate for outpatient PT upon d/c.    Follow Up Recommendations  Outpatient PT;Supervision for mobility/OOB     Equipment Recommendations  None recommended by PT    Recommendations for Other Services       Precautions / Restrictions Precautions Precautions: Fall    Mobility  Bed Mobility         Supine to sit: Modified independent (Device/Increase time) Sit to supine: Modified independent (Device/Increase time)      Transfers   Equipment used: None Transfers: Sit to/from Stand Sit to Stand: Min guard         General transfer comment: for safety  Ambulation/Gait Ambulation/Gait assistance: Min guard Gait Distance (Feet): 150 Feet Assistive device: None Gait Pattern/deviations: Step-through pattern;Decreased stride length;Wide base of support     General Gait Details: reports continued numbness on R LE, but no noted episodes of buckling   Stairs Stairs: Yes Stairs assistance: Supervision Stair Management: One rail Left;Alternating pattern;Forwards Number of Stairs: 2 General stair comments: one episode of LOB descending first step and grabbed  rail on L to rebalance   Wheelchair Mobility    Modified Rankin (Stroke Patients Only) Modified Rankin (Stroke Patients Only) Pre-Morbid Rankin Score: No symptoms Modified Rankin: Moderately severe disability     Balance Overall balance assessment: Needs assistance Sitting-balance support: No upper extremity supported Sitting balance-Leahy Scale: Good     Standing balance support: No upper extremity supported Standing balance-Leahy Scale: Good Standing balance comment: stood to wash hands and stood to toilet in bathroom                 Standardized Balance Assessment Standardized Balance Assessment : Dynamic Gait Index   Dynamic Gait Index Level Surface: Mild Impairment Change in Gait Speed: Mild Impairment Gait with Horizontal Head Turns: Mild Impairment Gait with Vertical Head Turns: Mild Impairment Gait and Pivot Turn: Normal Step Over Obstacle: Normal Step Around Obstacles: Normal Steps: Mild Impairment Total Score: 19      Cognition Arousal/Alertness: Awake/alert Behavior During Therapy: WFL for tasks assessed/performed Overall Cognitive Status: Within Functional Limits for tasks assessed                                        Exercises      General Comments General comments (skin integrity, edema, etc.): mom and dad in room; educated pt would need transport to outpatient PT once back at home (they live in [place], pt lives in [place])      Pertinent Vitals/Pain Pain Score: 5  Pain Location: headache (frontal), left hand Pain Descriptors / Indicators: Headache  Pain Intervention(s): Monitored during session;Patient requesting pain meds-RN notified    Home Living                      Prior Function            PT Goals (current goals can now be found in the care plan section) Progress towards PT goals: Progressing toward goals    Frequency    Min 4X/week      PT Plan Current plan remains appropriate     Co-evaluation              AM-PAC PT "6 Clicks" Daily Activity  Outcome Measure  Difficulty turning over in bed (including adjusting bedclothes, sheets and blankets)?: None Difficulty moving from lying on back to sitting on the side of the bed? : None Difficulty sitting down on and standing up from a chair with arms (e.g., wheelchair, bedside commode, etc,.)?: Unable Help needed moving to and from a bed to chair (including a wheelchair)?: A Little Help needed walking in hospital room?: A Little Help needed climbing 3-5 steps with a railing? : A Little 6 Click Score: 18    End of Session Equipment Utilized During Treatment: Gait belt Activity Tolerance: Patient tolerated treatment well Patient left: in bed;with call bell/phone within reach   PT Visit Diagnosis: Other abnormalities of gait and mobility (R26.89);Other symptoms and signs involving the nervous system (R29.898);Hemiplegia and hemiparesis Hemiplegia - Right/Left: Right Hemiplegia - dominant/non-dominant: Dominant Hemiplegia - caused by: Cerebral infarction     Time: 0981-1914 PT Time Calculation (min) (ACUTE ONLY): 23 min  Charges:  $Gait Training: 8-22 mins $Self Care/Home Management: 8-22                     Sheran Lawless, PT Acute Rehabilitation Services [phone] 01/04/2018    Elray Mcgregor 01/04/2018, 3:46 PM

## 2018-01-04 NOTE — Evaluation (Signed)
Speech Language Pathology Evaluation Patient Details Name: Gary Steele MRN: [id number] DOB: 03-01-1981 Today's Date: 01/04/2018 Time: 4098-1191 SLP Time Calculation (min) (ACUTE ONLY): 33 min  Problem List:  Patient Active Problem List   Diagnosis Date Noted  . Stroke (HCC) 01/02/2018  . Posterior cerebral circulation hemorrhagic infarction, left (HCC) 01/02/2018   Past Medical History: No past medical history on file. Past Surgical History: The histories are not reviewed yet. Please review them in the "History" navigator section and refresh this SmartLink. HPI:  37 y.o. male, who is visiting [place] from out of town for a work meeting and began having R sided weakness, numbness, difficulty with speech and blurred vision. MRI primarily posterior circulation ischemia with predominant involvement of left PCA, but additional involvement of left cerebellum and righ pons; additional punctate areas of acute ischemia ( left frontal, right occipital). Left VA dissection. 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   Assessment / Plan / Recommendation Clinical Impression  Pt was alert, pleasant, and engaged throughout evaluation that revealed mild cognitive-linguistic deficits. He was oriented X4 and provided thorough baseline functioning information with appropriate degree of detail. 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 He stated intermittent word finding difficulties and a decline in memory new to this hospital admission. He demonstrated intellectual awareness, sustained and selective attention, and basic problem solving skills WFL, however overall processing time was delayed. Pt performed confrontation naming tasks with 66% accuracy; phonemic cues were not effective at eliciting correct responses, however pt could generate semantic information  related to object. Timed generative naming task also proved to be difficult. Moderate short term memory impairments were present, as pt required multuple choice cue to recall 3 of 5 newly learned words, and category cues to recalled 2 remaining words. Pt would benefit from higher level cognitive-linguistic and executive function treatment; ST will provide in acute setting and recommend outpatient ST services upon d/c.    SLP Assessment  SLP Recommendation/Assessment: Patient needs continued Speech Lanaguage Pathology Services SLP Visit Diagnosis: Cognitive communication deficit (R41.841)    Follow Up Recommendations  Outpatient SLP    Frequency and Duration min 1 x/week  2 weeks      SLP Evaluation Cognition  Overall Cognitive Status: Impaired/Different from baseline Arousal/Alertness: Awake/alert Orientation Level: Oriented X4 Attention: Focused;Sustained;Selective Focused Attention: Appears intact Sustained Attention: Appears intact Selective Attention: Appears intact Selective Attention Impairment: (none) Memory: Impaired Memory Impairment: Storage deficit;Decreased recall of new information;Decreased short term memory Decreased Short Term Memory: Verbal basic Awareness: Appears intact Problem Solving: Appears intact Executive Function: Initiating Initiating: Appears intact Initiating Impairment: (none) Safety/Judgment: Appears intact       Comprehension  Auditory Comprehension Overall Auditory Comprehension: Appears within functional limits for tasks assessed Yes/No Questions: Not tested Commands: Within Functional Limits Conversation: Simple Interfering Components: Pain;Visual impairments;Processing speed EffectiveTechniques: Extra processing time Visual Recognition/Discrimination Discrimination: Not tested Reading Comprehension Reading Status: Not tested    Expression Expression Primary Mode of Expression: Verbal Verbal Expression Overall Verbal Expression:  Appears within functional limits for tasks assessed Initiation: No impairment Level of Generative/Spontaneous Verbalization: Conversation Repetition: (NT) Naming: Impairment Confrontation: Impaired Divergent: Not tested Pragmatics: No impairment Written Expression Dominant Hand: Right Written Expression: Not tested   Oral / Motor  Oral Motor/Sensory Function Overall Oral Motor/Sensory Function: Within functional limits Motor Speech Overall Motor Speech: Appears within functional limits for tasks assessed Respiration:  Within functional limits Phonation: Normal Resonance: Within functional limits Articulation: Within functional limitis Intelligibility: Intelligible Motor Planning: Witnin functional limits Motor Speech Errors: Not applicable   Suzzette Righter, Student SLP                     Suzzette Righter 01/04/2018, 10:36 AM

## 2018-01-04 NOTE — Progress Notes (Signed)
VASCULAR LAB PRELIMINARY  PRELIMINARY  PRELIMINARY  PRELIMINARY  Left upper extremity venous duplex completed.    Preliminary report:  There is no obvious evidence of DVT or SVT noted in the left upper extremity.   Gary Steele, RVT 01/04/2018, 2:43 PM

## 2018-01-04 NOTE — Anesthesia Preprocedure Evaluation (Signed)
Anesthesia Evaluation  Patient identified by MRN, date of birth, ID band Patient awake    Reviewed: Allergy & Precautions, NPO status , Unable to perform ROS - Chart review onlyPreop documentation limited or incomplete due to emergent nature of procedure.  Airway Mallampati: II   Neck ROM: full    Dental   Pulmonary    breath sounds clear to auscultation       Cardiovascular  Rhythm:regular Rate:Normal     Neuro/Psych CVA    GI/Hepatic   Endo/Other  obese  Renal/GU      Musculoskeletal   Abdominal   Peds  Hematology   Anesthesia Other Findings   Reproductive/Obstetrics                             Anesthesia Physical Anesthesia Plan  ASA: III and emergent  Anesthesia Plan: General   Post-op Pain Management:    Induction: Intravenous  PONV Risk Score and Plan: 2 and Ondansetron, Dexamethasone and Treatment may vary due to age or medical condition  Airway Management Planned: Oral ETT  Additional Equipment: Arterial line  Intra-op Plan:   Post-operative Plan: Extubation in OR  Informed Consent: I have reviewed the patients History and Physical, chart, labs and discussed the procedure including the risks, benefits and alternatives for the proposed anesthesia with the patient or authorized representative who has indicated his/her understanding and acceptance.     Plan Discussed with: CRNA, Anesthesiologist and Surgeon  Anesthesia Plan Comments:         Anesthesia Quick Evaluation

## 2018-01-04 NOTE — Progress Notes (Signed)
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 Corliss Skains. Left vertebral artery V2, V3, and V4 segment acute dissection s/p reconstruction using #3 pipeline flow diverter devices 01/02/2018 with Dr. Corliss Skains. Patient awake and alert laying in bed. Feeling better each day. Tolerating diet, feels like his speech is improving. Main c/o this am is (L)UE edema, US duplex ordered, not yet done   Allergies: Patient has no known allergies.  Medications:  Current Facility-Administered Medications:  .   stroke: mapping our early stages of recovery book, , Does not apply, Once, Ulice Dash, PA-C .  acetaminophen (TYLENOL) tablet 650 mg, 650 mg, Oral, Q4H PRN, 650 mg at 01/04/18 0753 **OR** [DISCONTINUED] acetaminophen (TYLENOL) solution 650 mg, 650 mg, Per Tube, Q4H PRN **OR** [DISCONTINUED] acetaminophen (TYLENOL) suppository 650 mg, 650 mg, Rectal, Q4H PRN, Deveshwar, Sanjeev, MD .  aspirin chewable tablet 81 mg, 81 mg, Oral, Daily, 81 mg at 01/04/18 1020 **OR** [DISCONTINUED] aspirin chewable tablet 81 mg, 81 mg, Per Tube, Daily, Deveshwar, Sanjeev, MD .  atorvastatin (LIPITOR) tablet 80 mg, 80 mg, Oral, q1800, Marvel Plan, MD, 80 mg at 01/03/18 1734 .  folic acid (FOLVITE) tablet 1 mg, 1 mg, Oral, Daily, Marvel Plan, MD, 1 mg at 01/04/18 1020 .  labetalol (NORMODYNE,TRANDATE) injection 10 mg, 10 mg, Intravenous, Q2H PRN, Marvel Plan, MD .  multivitamin (PROSIGHT) tablet 1 tablet, 1 tablet, Oral, Daily, Marvel Plan, MD, 1 tablet at 01/04/18 1020 .  ondansetron (ZOFRAN) injection 4 mg, 4 mg, Intravenous, Q6H PRN, Marvel Plan, MD, 4 mg at 01/03/18 1149 .  pantoprazole (PROTONIX) EC tablet 40 mg, 40 mg, Oral, Daily,  Marvel Plan, MD, 40 mg at 01/04/18 1021 .  senna-docusate (Senokot-S) tablet 1 tablet, 1 tablet, Oral, QHS PRN, Ulice Dash, PA-C .  thiamine (VITAMIN B-1) tablet 100 mg, 100 mg, Oral, Daily, Marvel Plan, MD, 100 mg at 01/04/18 1020 .  ticagrelor (BRILINTA) tablet 90 mg, 90 mg, Oral, BID, Biby, Sharon L, NP    Vital Signs: BP (!) 128/91   Pulse 91   Temp 98.6 F (37 C) (Axillary)   Resp 16   Wt 126.9 kg   SpO2 96%   Physical Exam  Constitutional: He appears well-developed and well-nourished. No distress.  Pulmonary/Chest: Effort normal. No respiratory distress.  Musculoskeletal:  (L)UE edema, moreso the forearm and hand.  Neurological:  Alert, awake, and oriented x3. Speech and comprehension intact. PERRL bilaterally. EOMs bilaterally without nystagmus or subjective diplopia. Visual fields not assessed. No facial asymmetry. Tongue midline. Can spontaneously move all extremities. No pronator drift. Fine motor and coordination intact and symmetric. Distal pulses 2+ bilaterally.  Skin: Skin is warm and dry.  Right groin incision soft without active bleeding or hematoma.  Psychiatric: He has a normal mood and affect. His behavior is normal. Judgment and thought content normal.  Nursing note and vitals reviewed.   Imaging: Ct Angio Head W Or Wo Contrast  Result Date: 01/02/2018 CLINICAL DATA:  Right-sided weakness, slurred speech, right homonymous hemianopia, and confusion. EXAM: CT ANGIOGRAPHY HEAD AND NECK CT PERFUSION BRAIN TECHNIQUE: Multidetector CT imaging of the head and neck was performed using the standard protocol during bolus administration of intravenous contrast. Multiplanar CT image reconstructions and MIPs were obtained to evaluate the vascular anatomy. Carotid stenosis  measurements (when applicable) are obtained utilizing NASCET criteria, using the distal internal carotid diameter as the denominator. Multiphase CT imaging of the brain was performed following IV  bolus contrast injection. Subsequent parametric perfusion maps were calculated using RAPID software. CONTRAST:  ISOVUE-370 IOPAMIDOL (ISOVUE-370) INJECTION 76%; 40mL ISOVUE-370 IOPAMIDOL (ISOVUE-370) INJECTION 76% COMPARISON:  None. FINDINGS: CTA NECK FINDINGS Bolus timing is suboptimal. Aortic arch: Standard 3 vessel aortic arch with widely patent arch vessel origins. Right carotid system: Patent without evidence of stenosis, dissection, or significant atherosclerosis. Left carotid system: Patent without evidence of stenosis, dissection, or significant atherosclerosis. Vertebral arteries: The vertebral arteries are patent and codominant with a normal appearance on the right. There is an abnormal peripheral, eccentric rim of soft tissue along the left V3 segment with moderate luminal narrowing greatest in the C1 foramen. Skeleton: No acute osseous abnormality or suspicious osseous lesion. Other neck: No evidence of acute abnormality or mass. Upper chest: Clear lung apices. Review of the MIP images confirms the above findings CTA HEAD FINDINGS Suboptimal arterial opacification limits assessment of the small and medium-sized vessels. Anterior circulation: The internal carotid arteries are patent from skull base to carotid termini without evidence of significant stenosis. ACAs and MCAs are patent without evidence of proximal branch occlusion. Assessment for stenosis is limited by image noise creating a diffusely irregular appearance of the vasculature however no convincing flow limiting A1 or M1 stenosis is evident. The right A1 segment is hypoplastic. No aneurysm is identified. Posterior circulation: The intracranial vertebral arteries are patent to the basilar. Patent PICA and SCA origins are identified bilaterally. The basilar artery is patent with mild diffuse irregularity which is likely artifactual and no evidence of significant stenosis. Both P1 and proximal P2 segments are patent with assessment for  stenosis limited by image noise. There is however a left PCA branch vessel occlusion at the P2 bifurcation. No aneurysm is identified. Venous sinuses: As permitted by contrast timing, patent. Anatomic variants: Hypoplastic right A1. Delayed phase: Not performed. Review of the MIP images confirms the above findings CT Brain Perfusion Findings: Perfusion imaging in this case is considered unreliable given the diffusely noisy appearance of the generated perfusion maps, although there is the suggestion of reduced CBV in the mesial left temporal and occipital lobes. IMPRESSION: 1. Abnormal appearance of the left V3 segment concerning for dissection. 2. Left P2 branch occlusion. 3. Widely patent cervical carotid arteries. 4. Poor quality perfusion imaging with extensive image noise. Initial study review was in person with Dr. Wilford Corner on 01/02/2018 at 2:18 pm with preliminary finding of no large vessel occlusion. 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 Wilford Corner at 2:39 pm. Electronically Signed   By: Sebastian Ache M.D.   On: 01/02/2018 15:00   Ct Angio Neck W Or Wo Contrast  Result Date: 01/02/2018 CLINICAL DATA:  Right-sided weakness, slurred speech, right homonymous hemianopia, and confusion. EXAM: CT ANGIOGRAPHY HEAD AND NECK CT PERFUSION BRAIN TECHNIQUE: Multidetector CT imaging of the head and neck was performed using the standard protocol during bolus administration of intravenous contrast. Multiplanar CT image reconstructions and MIPs were obtained to evaluate the vascular anatomy. Carotid stenosis measurements (when applicable) are obtained utilizing NASCET criteria, using the distal internal carotid diameter as the denominator. Multiphase CT imaging of the brain was performed following IV bolus contrast injection. Subsequent parametric perfusion maps were calculated using RAPID software. CONTRAST:  ISOVUE-370 IOPAMIDOL  (ISOVUE-370) INJECTION 76%;  40mL ISOVUE-370 IOPAMIDOL (ISOVUE-370) INJECTION 76% COMPARISON:  None. FINDINGS: CTA NECK FINDINGS Bolus timing is suboptimal. Aortic arch: Standard 3 vessel aortic arch with widely patent arch vessel origins. Right carotid system: Patent without evidence of stenosis, dissection, or significant atherosclerosis. Left carotid system: Patent without evidence of stenosis, dissection, or significant atherosclerosis. Vertebral arteries: The vertebral arteries are patent and codominant with a normal appearance on the right. There is an abnormal peripheral, eccentric rim of soft tissue along the left V3 segment with moderate luminal narrowing greatest in the C1 foramen. Skeleton: No acute osseous abnormality or suspicious osseous lesion. Other neck: No evidence of acute abnormality or mass. Upper chest: Clear lung apices. Review of the MIP images confirms the above findings CTA HEAD FINDINGS Suboptimal arterial opacification limits assessment of the small and medium-sized vessels. Anterior circulation: The internal carotid arteries are patent from skull base to carotid termini without evidence of significant stenosis. ACAs and MCAs are patent without evidence of proximal branch occlusion. Assessment for stenosis is limited by image noise creating a diffusely irregular appearance of the vasculature however no convincing flow limiting A1 or M1 stenosis is evident. The right A1 segment is hypoplastic. No aneurysm is identified. Posterior circulation: The intracranial vertebral arteries are patent to the basilar. Patent PICA and SCA origins are identified bilaterally. The basilar artery is patent with mild diffuse irregularity which is likely artifactual and no evidence of significant stenosis. Both P1 and proximal P2 segments are patent with assessment for stenosis limited by image noise. There is however a left PCA branch vessel occlusion at the P2 bifurcation. No aneurysm is identified. Venous  sinuses: As permitted by contrast timing, patent. Anatomic variants: Hypoplastic right A1. Delayed phase: Not performed. Review of the MIP images confirms the above findings CT Brain Perfusion Findings: Perfusion imaging in this case is considered unreliable given the diffusely noisy appearance of the generated perfusion maps, although there is the suggestion of reduced CBV in the mesial left temporal and occipital lobes. IMPRESSION: 1. Abnormal appearance of the left V3 segment concerning for dissection. 2. Left P2 branch occlusion. 3. Widely patent cervical carotid arteries. 4. Poor quality perfusion imaging with extensive image noise. Initial study review was in person with Dr. Wilford Corner on 01/02/2018 at 2:18 pm with preliminary finding of no large vessel occlusion. 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 Wilford Corner at 2:39 pm. Electronically Signed   By: Sebastian Ache M.D.   On: 01/02/2018 15:00   Mr Shirlee Latch ZO Contrast  Result Date: 01/03/2018 CLINICAL DATA:  Status post revascularization of the occluded LEFT PCA P1 segment, TICI3 revascularization. Reconstruction of the LEFT vertebral artery in its V2, V3, and V4 segments with a pipeline flow diverter. History of hypertension. EXAM: MRI HEAD WITHOUT CONTRAST MRA HEAD WITHOUT CONTRAST TECHNIQUE: Multiplanar, multiecho pulse sequences of the brain and surrounding structures were obtained without intravenous contrast. Angiographic images of the head were obtained using MRA technique without contrast. COMPARISON:  CTA head neck 01/02/2018. MR brain 01/02/2018. Endovascular procedure 01/02/2018. FINDINGS: MRI HEAD FINDINGS Brain: Restricted diffusion, corresponding low ADC,involving much of the LEFT PCA territory, including posterior temporal and occipital lobe, extending to LEFT thalamus consistent with acute infarction. Additional areas of restricted diffusion representing acute  infarction include punctate RIGHT inferior occipital cortex, LEFT cerebellum roughly 2 cm, subcentimeter RIGHT pons, and LEFT frontal cortex. Within limits for assessment on MR, no reperfusion hemorrhage at this time. Mild  swelling of the posterior temporal lobe on the LEFT, extending into the quadrigeminal plate cistern. No midline shift. Normal for age cerebral volume. Premature nonacute T2 and FLAIR hyperintensities in the white matter which could represent small vessel disease, vasculitis, complicated migraine, chronic infection, or idiopathic. Vascular: Reported separately. Skull and upper cervical spine: Normal marrow signal. Sinuses/Orbits: Minor chronic sinus disease.  Negative orbits. Other: None. MRA HEAD FINDINGS The internal carotid arteries are widely patent. Widely patent anterior circulation. The basilar artery is widely patent, LEFT vertebral dominant, but both contributing. No evidence for LEFT PCA stenosis or dissection. RIGHT posterior cerebral artery normal. Dominant RIGHT PICA. BILATERAL superior cerebral artery patency. LEFT AICA patent, RIGHT AICA not visualized. IMPRESSION: Primarily posterior circulation ischemia, with predominant involvement of the LEFT PCA, but additional involvement of LEFT cerebellum and RIGHT pons, similar to the prior MR 11/04/2017. Additional punctate areas of acute ischemia, for instance LEFT frontal, RIGHT occipital, were not present previously, but doubt clinically significant. Premature for age white matter changes, uncertain etiology. No reperfusion hemorrhage is evident with particular attention to the LEFT PCA territory. Wide patency of the endovascular treated areas on intracranial MRA, specifically LEFT vertebral and LEFT PCA. Electronically Signed   By: Elsie Stain M.D.   On: 01/03/2018 15:04   Mr Brain Wo Contrast  Result Date: 01/03/2018 CLINICAL DATA:  Status post revascularization of the occluded LEFT PCA P1 segment, TICI3 revascularization.  Reconstruction of the LEFT vertebral artery in its V2, V3, and V4 segments with a pipeline flow diverter. History of hypertension. EXAM: MRI HEAD WITHOUT CONTRAST MRA HEAD WITHOUT CONTRAST TECHNIQUE: Multiplanar, multiecho pulse sequences of the brain and surrounding structures were obtained without intravenous contrast. Angiographic images of the head were obtained using MRA technique without contrast. COMPARISON:  CTA head neck 01/02/2018. MR brain 01/02/2018. Endovascular procedure 01/02/2018. FINDINGS: MRI HEAD FINDINGS Brain: Restricted diffusion, corresponding low ADC,involving much of the LEFT PCA territory, including posterior temporal and occipital lobe, extending to LEFT thalamus consistent with acute infarction. Additional areas of restricted diffusion representing acute infarction include punctate RIGHT inferior occipital cortex, LEFT cerebellum roughly 2 cm, subcentimeter RIGHT pons, and LEFT frontal cortex. Within limits for assessment on MR, no reperfusion hemorrhage at this time. Mild swelling of the posterior temporal lobe on the LEFT, extending into the quadrigeminal plate cistern. No midline shift. Normal for age cerebral volume. Premature nonacute T2 and FLAIR hyperintensities in the white matter which could represent small vessel disease, vasculitis, complicated migraine, chronic infection, or idiopathic. Vascular: Reported separately. Skull and upper cervical spine: Normal marrow signal. Sinuses/Orbits: Minor chronic sinus disease.  Negative orbits. Other: None. MRA HEAD FINDINGS The internal carotid arteries are widely patent. Widely patent anterior circulation. The basilar artery is widely patent, LEFT vertebral dominant, but both contributing. No evidence for LEFT PCA stenosis or dissection. RIGHT posterior cerebral artery normal. Dominant RIGHT PICA. BILATERAL superior cerebral artery patency. LEFT AICA patent, RIGHT AICA not visualized. IMPRESSION: Primarily posterior circulation ischemia,  with predominant involvement of the LEFT PCA, but additional involvement of LEFT cerebellum and RIGHT pons, similar to the prior MR 11/04/2017. Additional punctate areas of acute ischemia, for instance LEFT frontal, RIGHT occipital, were not present previously, but doubt clinically significant. Premature for age white matter changes, uncertain etiology. No reperfusion hemorrhage is evident with particular attention to the LEFT PCA territory. Wide patency of the endovascular treated areas on intracranial MRA, specifically LEFT vertebral and LEFT PCA. Electronically Signed   By: Dale Claypool.D.  On: 01/03/2018 15:04   Mr Brain Wo Contrast  Result Date: 01/02/2018 CLINICAL DATA:  Right-sided weakness, slurred speech, right homonymous hemianopia, and confusion. EXAM: MRI HEAD WITHOUT CONTRAST TECHNIQUE: Multiplanar, multiecho pulse sequences of the brain and surrounding structures were obtained without intravenous contrast. COMPARISON:  Head CT, CTA, and cerebral perfusion earlier today FINDINGS: Limited imaging consisting of axial and coronal diffusion and T2 FLAIR sequences were obtained at the direction of the attending stroke neurologist. There is an acute, moderately large left PCA infarct involving the mesial left temporal and occipital lobes. Infarct extends to the lateral aspect of the left thalamus. There is also a 2 cm acute infarct posteriorly in the left cerebral hemisphere, and there is a 5 mm acute infarct in the right pons. No gross intracranial hemorrhage is identified. There is abnormal FLAIR hyperintensity in the PCA at the P2 level with a branch vessel occlusion shown on earlier CTA. Scattered punctate foci of T2 hyperintensity are present in the cerebral white matter bilaterally, mild but abnormal for age. The ventricles and sulci are normal. Mucous retention cysts are noted in the maxillary sinuses. IMPRESSION: 1. Moderately large acute left PCA infarct. 2. Smaller acute infarcts in the left  cerebellum and right pons. 3. Mild cerebral white matter T2 signal changes, nonspecific. Considerations include early chronic small vessel ischemia, headaches, prior infection/inflammation, prior trauma, vasculitis, and less likely demyelinating disease. Electronically Signed   By: Sebastian Ache M.D.   On: 01/02/2018 15:06   Ct Cerebral Perfusion W Contrast  Result Date: 01/02/2018 CLINICAL DATA:  Right-sided weakness, slurred speech, right homonymous hemianopia, and confusion. EXAM: CT ANGIOGRAPHY HEAD AND NECK CT PERFUSION BRAIN TECHNIQUE: Multidetector CT imaging of the head and neck was performed using the standard protocol during bolus administration of intravenous contrast. Multiplanar CT image reconstructions and MIPs were obtained to evaluate the vascular anatomy. Carotid stenosis measurements (when applicable) are obtained utilizing NASCET criteria, using the distal internal carotid diameter as the denominator. Multiphase CT imaging of the brain was performed following IV bolus contrast injection. Subsequent parametric perfusion maps were calculated using RAPID software. CONTRAST:  ISOVUE-370 IOPAMIDOL (ISOVUE-370) INJECTION 76%; 40mL ISOVUE-370 IOPAMIDOL (ISOVUE-370) INJECTION 76% COMPARISON:  None. FINDINGS: CTA NECK FINDINGS Bolus timing is suboptimal. Aortic arch: Standard 3 vessel aortic arch with widely patent arch vessel origins. Right carotid system: Patent without evidence of stenosis, dissection, or significant atherosclerosis. Left carotid system: Patent without evidence of stenosis, dissection, or significant atherosclerosis. Vertebral arteries: The vertebral arteries are patent and codominant with a normal appearance on the right. There is an abnormal peripheral, eccentric rim of soft tissue along the left V3 segment with moderate luminal narrowing greatest in the C1 foramen. Skeleton: No acute osseous abnormality or suspicious osseous lesion. Other neck: No evidence of acute  abnormality or mass. Upper chest: Clear lung apices. Review of the MIP images confirms the above findings CTA HEAD FINDINGS Suboptimal arterial opacification limits assessment of the small and medium-sized vessels. Anterior circulation: The internal carotid arteries are patent from skull base to carotid termini without evidence of significant stenosis. ACAs and MCAs are patent without evidence of proximal branch occlusion. Assessment for stenosis is limited by image noise creating a diffusely irregular appearance of the vasculature however no convincing flow limiting A1 or M1 stenosis is evident. The right A1 segment is hypoplastic. No aneurysm is identified. Posterior circulation: The intracranial vertebral arteries are patent to the basilar. Patent PICA and SCA origins are identified bilaterally. The basilar artery is  patent with mild diffuse irregularity which is likely artifactual and no evidence of significant stenosis. Both P1 and proximal P2 segments are patent with assessment for stenosis limited by image noise. There is however a left PCA branch vessel occlusion at the P2 bifurcation. No aneurysm is identified. Venous sinuses: As permitted by contrast timing, patent. Anatomic variants: Hypoplastic right A1. Delayed phase: Not performed. Review of the MIP images confirms the above findings CT Brain Perfusion Findings: Perfusion imaging in this case is considered unreliable given the diffusely noisy appearance of the generated perfusion maps, although there is the suggestion of reduced CBV in the mesial left temporal and occipital lobes. IMPRESSION: 1. Abnormal appearance of the left V3 segment concerning for dissection. 2. Left P2 branch occlusion. 3. Widely patent cervical carotid arteries. 4. Poor quality perfusion imaging with extensive image noise. Initial study review was in person with Dr. Wilford Corner on 01/02/2018 at 2:18 pm with preliminary finding of no large vessel occlusion. 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 Wilford Corner at 2:39 pm. Electronically Signed   By: Sebastian Ache M.D.   On: 01/02/2018 15:00   Ct Head Code Stroke Wo Contrast  Addendum Date: 01/02/2018   ADDENDUM REPORT: 01/02/2018 15:12 ADDENDUM: On further review, there is cortical hypoattenuation in the mesial left temporal lobe and occipital lobe which is consistent with an acute PCA infarct and confirmed on subsequent MRI. Hyperdense focus in the left ambient cistern likely reflects an acute P2 embolus. Electronically Signed   By: Sebastian Ache M.D.   On: 01/02/2018 15:12   Result Date: 01/02/2018 CLINICAL DATA:  Code stroke. Right-sided weakness, slurred speech, right homonymous hemianopia, and confusion. EXAM: CT HEAD WITHOUT CONTRAST TECHNIQUE: Contiguous axial images were obtained from the base of the skull through the vertex without intravenous contrast. COMPARISON:  None. FINDINGS: Brain: There is no evidence of acute infarct, intracranial hemorrhage, mass, midline shift, or extra-axial fluid collection. The ventricles and sulci are normal. Vascular: No suspicious vessel hyperdensity. Skull: No fracture or focal osseous lesion. Sinuses/Orbits: Small bilateral maxillary sinus mucous retention cysts. Clear mastoid air cells. Unremarkable orbits. Other: None. ASPECTS [facility] Stroke Program Early CT Score) - Ganglionic level infarction (caudate, lentiform nuclei, internal capsule, insula, M1-M3 cortex): 7 - Supraganglionic infarction (M4-M6 cortex): 3 Total score (0-10 with 10 being normal): 10 IMPRESSION: 1. Unremarkable CT appearance of the brain. 2. ASPECTS is 10. The study was reviewed in person with Dr. Wilford Corner on 01/02/2018 at 2:18 p.m. Electronically Signed: By: Sebastian Ache M.D. On: 01/02/2018 14:30    Labs:  CBC: Recent Labs    01/02/18 1350 01/02/18 1356 01/03/18 0500  WBC 8.7  --  9.5  HGB 15.6 15.6 13.1  HCT 46.1 46.0 39.5  PLT  242  --  345    COAGS: Recent Labs    01/02/18 1350  INR 1.04  APTT 25    BMP: Recent Labs    01/02/18 1350 01/02/18 1356 01/03/18 0500 01/04/18 0406  NA 140 140 135 140  K 3.1* 3.2* 4.9 3.6  CL 104 105 105 109  CO2 22  --  20* 23  GLUCOSE 120* 122* 110* 106*  BUN 12 14 7 7   CALCIUM 9.1  --  8.2* 8.7*  CREATININE 0.85 0.80 0.43* 0.79  GFRNONAA >60  --  >60 >60  GFRAA >60  --  >60 >60    LIVER FUNCTION TESTS: Recent Labs  01/02/18 1350  BILITOT 1.0  AST 27  ALT 34  ALKPHOS 58  PROT 7.2  ALBUMIN 4.2    Assessment and Plan:  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 Corliss Skains. Left vertebral artery V2, V3, and V4 segment acute dissection s/p reconstruction using #3 pipeline flow diverter devices 01/02/2018 with Dr. Corliss Skains. Patient's condition improving- can move all extremities but still experiencing blurred vision. Right groin incision stable. Continue taking Brilinta 90 mg twice daily and Aspirin 81 mg once daily. Plan for MRI/MRA brain/head and CT head today. Appreciate and agree with neurology management. Plan to follow-up in clinic with Dr. Corliss Skains 3 months after discharge. IR to follow.   Electronically Signed: Brayton El, PA-C 01/04/2018, 11:28 AM   I spent a total of 15 Minutes at the the patient's bedside AND on the patient's hospital floor or unit, greater than 50% of which was counseling/coordinating care for left PCA P1 segment occlusion s/p revascularization AND left VA V2, V3, and V4 segments acute dissection s/p reconstruction.

## 2018-01-04 NOTE — Progress Notes (Signed)
    [facility] has been requested to perform a transesophageal echocardiogram on Gary Steele for stroke.  After careful review of history and examination, the risks and benefits of transesophageal echocardiogram have been explained including risks of esophageal damage, perforation (1:10,000 risk), bleeding, pharyngeal hematoma as well as other potential complications associated with conscious sedation including aspiration, arrhythmia, respiratory failure and death. Alternatives to treatment were discussed, questions were answered. Patient is willing to proceed.  NPO after midnight. Meds with sips.   Manson Passey, PA-C 01/04/2018 6:23 PM

## 2018-01-04 NOTE — Progress Notes (Signed)
Occupational Therapy Treatment Patient Details Name: Gary Steele MRN: [id number] DOB: [birth year] Today's Date: 01/04/2018    History of present illness 37 y.o. male, who is visiting [place] from out of town for a work meeting and began having R sided weakness, numbness, difficulty with speech and blurred vision. Suspected stroke s/p IV tPA.  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  Pt with no other significant PMH.     OT comments  Pt is now able to perform ADLs with supervision including tub transfer and simulated shower in standing.  Pt reports vision is improving, but still with blurriness and difficulty reading.  Pt demonstrates impaired convergence.  Wife brought in glasses.   Recommend OP neuro OT and follow up with ophthalmologist   Follow Up Recommendations  Outpatient OT;Supervision - Intermittent    Equipment Recommendations  None recommended by OT    Recommendations for Other Services      Precautions / Restrictions Precautions Precautions: Fall       Mobility Bed Mobility Overal bed mobility: Modified Independent       Supine to sit: Modified independent (Device/Increase time) Sit to supine: Modified independent (Device/Increase time)   General bed mobility comments: moves slowly   Transfers Overall transfer level: Needs assistance Equipment used: None Transfers: Sit to/from [employer] Sit to Stand: Supervision Stand pivot transfers: Supervision       General transfer comment: for safety    Balance Overall balance assessment: Mild deficits observed, not formally tested Sitting-balance support: No upper extremity supported Sitting balance-Leahy Scale: Good     Standing balance support: No upper extremity supported Standing balance-Leahy Scale: Good Standing balance comment: stood to wash hands and stood to  toilet in bathroom                 Standardized Balance Assessment Standardized Balance Assessment : Dynamic Gait Index   Dynamic Gait Index Level Surface: Mild Impairment Change in Gait Speed: Mild Impairment Gait with Horizontal Head Turns: Mild Impairment Gait with Vertical Head Turns: Mild Impairment Gait and Pivot Turn: Normal Step Over Obstacle: Normal Step Around Obstacles: Normal Steps: Mild Impairment Total Score: 19     ADL either performed or assessed with clinical judgement   ADL Overall ADL's : Needs assistance/impaired Eating/Feeding: Independent   Grooming: Wash/dry hands;Wash/dry face;Oral care;Brushing hair;Supervision/safety;Standing   Upper Body Bathing: Set up;Sitting   Lower Body Bathing: Supervison/ safety;Sit to/from stand   Upper Body Dressing : Set up;Sitting   Lower Body Dressing: Supervision/safety;Sit to/from stand   Toilet Transfer: Supervision/safety;Ambulation;Comfort height toilet       Tub/ Shower Transfer: Tub transfer;Min guard;Ambulation   Functional mobility during ADLs: Supervision/safety General ADL Comments: Pt able to simulate shower in standing with supervision      Vision   Convergence: Impaired (comment) Visual Fields: Right superior homonymous quadranopsia Additional Comments: Pt wtih improved ability to read, but continues with difficulty with small print and has to make head and eye shifts to read lines of inf.   Convergence impaired to ~17" from nose.  Pt reports blurriness that has improved some.  Wife brought glasses which improved vision, but still not at baseline from an acuity standpoint    Perception     Praxis      Cognition Arousal/Alertness: Awake/alert Behavior During Therapy: WFL for tasks assessed/performed Overall Cognitive Status: Within Functional Limits for tasks assessed  Exercises     Shoulder Instructions       General  Comments mom and dad in room; educated pt would need transport to outpatient PT once back at home (they live in [place], pt lives in [place])    Pertinent Vitals/ Pain       Pain Assessment: No/denies pain Pain Score: 5  Pain Location: headache (frontal), left hand Pain Descriptors / Indicators: Headache Pain Intervention(s): Monitored during session;Patient requesting pain meds-RN notified  Home Living                                          Prior Functioning/Environment              Frequency  Min 3X/week        Progress Toward Goals  OT Goals(current goals can now be found in the care plan section)  Progress towards OT goals: Progressing toward goals     Plan Discharge plan remains appropriate;Equipment recommendations need to be updated    Co-evaluation                 AM-PAC PT "6 Clicks" Daily Activity     Outcome Measure   Help from another person eating meals?: None Help from another person taking care of personal grooming?: None Help from another person toileting, which includes using toliet, bedpan, or urinal?: None Help from another person bathing (including washing, rinsing, drying)?: None Help from another person to put on and taking off regular upper body clothing?: None Help from another person to put on and taking off regular lower body clothing?: None 6 Click Score: 24    End of Session    OT Visit Diagnosis: Unsteadiness on feet (R26.81);Low vision, both eyes (H54.2);Pain Pain - Right/Left: Right Pain - part of body: Leg   Activity Tolerance Patient tolerated treatment well   Patient Left in bed;with call bell/phone within reach;with family/visitor present   Nurse Communication Mobility status        Time: 1610-9604 OT Time Calculation (min): 41 min  Charges: OT General Charges $OT Visit: 1 Visit OT Treatments $Self Care/Home Management : 23-37 mins $Therapeutic Activity: 8-22 mins  Jeani Hawking,  OTR/L Acute Rehabilitation Services Pager [phone] Office [phone]    Jeani Hawking M 01/04/2018, 5:19 PM

## 2018-01-04 NOTE — Discharge Summary (Addendum)
Stroke Discharge Summary  Patient ID: Gary Steele   MRN: [id number]      DOB: February 28, 1981  Date of Admission: 01/02/2018 Date of Discharge: 01/04/2018  Attending Physician:  Marvel Plan, MD, Stroke MD Consultant(s):     Raelyn Ensign, MD (Interventional Neuroradiologist)  Patient's PCP:  No primary care provider on file.  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 Start taking on:  01/06/2018   atorvastatin 40 MG tablet Commonly known as:  LIPITOR Take 1 tablet (40 mg total) by mouth daily at 6 PM.   fluticasone 50 MCG/ACT nasal spray Commonly known as:  FLONASE Place 2 sprays into both nostrils daily.   multivitamin Tabs tablet Take 1 tablet by mouth daily. Start taking on:  01/06/2018   ticagrelor 90 MG Tabs tablet Commonly known as:  BRILINTA Take 1 tablet (90 mg total) by mouth 2 (two) times daily.       LABORATORY STUDIES CBC    Component Value Date/Time   WBC 9.5 01/03/2018 0500   RBC 4.36 01/03/2018 0500   HGB 13.1 01/03/2018 0500   HCT 39.5 01/03/2018 0500   PLT 345 01/03/2018 0500   MCV 90.6 01/03/2018 0500   MCH 30.0 01/03/2018 0500   MCHC 33.2 01/03/2018 0500   RDW 12.4 01/03/2018 0500   LYMPHSABS 1.8 01/03/2018 0500   MONOABS 0.9 01/03/2018 0500   EOSABS 0.0 01/03/2018 0500   BASOSABS 0.1 01/03/2018 0500   CMP    Component Value  Date/Time   NA 140 01/04/2018 0406   K 3.6 01/04/2018 0406   CL 109 01/04/2018 0406   CO2 23 01/04/2018 0406   GLUCOSE 106 (H) 01/04/2018 0406   BUN 7 01/04/2018 0406   CREATININE 0.79 01/04/2018 0406   CALCIUM 8.7 (L) 01/04/2018 0406   PROT 7.2 01/02/2018 1350   ALBUMIN 4.2 01/02/2018 1350   AST 27 01/02/2018 1350   ALT 34 01/02/2018 1350   ALKPHOS 58 01/02/2018 1350   BILITOT 1.0 01/02/2018 1350   GFRNONAA >60 01/04/2018 0406   GFRAA >60 01/04/2018 0406   COAGS Lab Results  Component Value Date   INR 1.04 01/02/2018   Lipid Panel    Component Value Date/Time   CHOL 141 01/04/2018 0406   TRIG 148 01/04/2018 0406   HDL 40 (L) 01/04/2018 0406   CHOLHDL 3.5 01/04/2018 0406   VLDL 30 01/04/2018 0406   LDLCALC 71 01/04/2018 0406   HgbA1C  Lab Results  Component Value Date   HGBA1C 5.2 01/03/2018   Urinalysis No results found for: COLORURINE, APPEARANCEUR, LABSPEC, PHURINE, GLUCOSEU, HGBUR, BILIRUBINUR, KETONESUR, PROTEINUR, UROBILINOGEN, NITRITE, LEUKOCYTESUR Urine Drug Screen     Component Value Date/Time   LABOPIA NONE DETECTED 01/02/2018 2013   COCAINSCRNUR NONE DETECTED 01/02/2018 2013   LABBENZ NONE DETECTED 01/02/2018 2013  AMPHETMU NONE DETECTED 01/02/2018 2013   THCU NONE DETECTED 01/02/2018 2013   LABBARB NONE DETECTED 01/02/2018 2013    Alcohol Level No results found for: [facility]   SIGNIFICANT DIAGNOSTIC STUDIES Ct Angio Head W Or Wo Contrast  Result Date: 01/02/2018 CLINICAL DATA:  Right-sided weakness, slurred speech, right homonymous hemianopia, and confusion. EXAM: CT ANGIOGRAPHY HEAD AND NECK CT PERFUSION BRAIN TECHNIQUE: Multidetector CT imaging of the head and neck was performed using the standard protocol during bolus administration of intravenous contrast. Multiplanar CT image reconstructions and MIPs were obtained to evaluate the vascular anatomy. Carotid stenosis measurements (when applicable) are obtained utilizing NASCET criteria, using the  distal internal carotid diameter as the denominator. Multiphase CT imaging of the brain was performed following IV bolus contrast injection. Subsequent parametric perfusion maps were calculated using RAPID software. CONTRAST:  ISOVUE-370 IOPAMIDOL (ISOVUE-370) INJECTION 76%; 40mL ISOVUE-370 IOPAMIDOL (ISOVUE-370) INJECTION 76% COMPARISON:  None. FINDINGS: CTA NECK FINDINGS Bolus timing is suboptimal. Aortic arch: Standard 3 vessel aortic arch with widely patent arch vessel origins. Right carotid system: Patent without evidence of stenosis, dissection, or significant atherosclerosis. Left carotid system: Patent without evidence of stenosis, dissection, or significant atherosclerosis. Vertebral arteries: The vertebral arteries are patent and codominant with a normal appearance on the right. There is an abnormal peripheral, eccentric rim of soft tissue along the left V3 segment with moderate luminal narrowing greatest in the C1 foramen. Skeleton: No acute osseous abnormality or suspicious osseous lesion. Other neck: No evidence of acute abnormality or mass. Upper chest: Clear lung apices. Review of the MIP images confirms the above findings CTA HEAD FINDINGS Suboptimal arterial opacification limits assessment of the small and medium-sized vessels. Anterior circulation: The internal carotid arteries are patent from skull base to carotid termini without evidence of significant stenosis. ACAs and MCAs are patent without evidence of proximal branch occlusion. Assessment for stenosis is limited by image noise creating a diffusely irregular appearance of the vasculature however no convincing flow limiting A1 or M1 stenosis is evident. The right A1 segment is hypoplastic. No aneurysm is identified. Posterior circulation: The intracranial vertebral arteries are patent to the basilar. Patent PICA and SCA origins are identified bilaterally. The basilar artery is patent with mild diffuse irregularity which is likely  artifactual and no evidence of significant stenosis. Both P1 and proximal P2 segments are patent with assessment for stenosis limited by image noise. There is however a left PCA branch vessel occlusion at the P2 bifurcation. No aneurysm is identified. Venous sinuses: As permitted by contrast timing, patent. Anatomic variants: Hypoplastic right A1. Delayed phase: Not performed. Review of the MIP images confirms the above findings CT Brain Perfusion Findings: Perfusion imaging in this case is considered unreliable given the diffusely noisy appearance of the generated perfusion maps, although there is the suggestion of reduced CBV in the mesial left temporal and occipital lobes. IMPRESSION: 1. Abnormal appearance of the left V3 segment concerning for dissection. 2. Left P2 branch occlusion. 3. Widely patent cervical carotid arteries. 4. Poor quality perfusion imaging with extensive image noise. Initial study review was in person with Dr. Wilford Corner on 01/02/2018 at 2:18 pm with preliminary finding of no large vessel occlusion. 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 Wilford Corner at 2:39 pm. Electronically Signed   By: Sebastian Ache M.D.   On: 01/02/2018 15:00   Ct Angio Neck W Or Wo Contrast  Result Date: 01/02/2018 CLINICAL DATA:  Right-sided weakness, slurred speech, right homonymous hemianopia, and confusion. EXAM: CT ANGIOGRAPHY HEAD AND NECK CT PERFUSION BRAIN TECHNIQUE: Multidetector CT imaging of the head and neck was performed using the standard protocol during bolus administration of intravenous contrast. Multiplanar CT image reconstructions and MIPs were obtained to evaluate the vascular anatomy. Carotid stenosis measurements (when applicable) are obtained utilizing NASCET criteria, using the distal internal carotid diameter as the denominator. Multiphase CT imaging of the brain was performed following IV bolus contrast injection.  Subsequent parametric perfusion maps were calculated using RAPID software. CONTRAST:  ISOVUE-370 IOPAMIDOL (ISOVUE-370) INJECTION 76%; 40mL ISOVUE-370 IOPAMIDOL (ISOVUE-370) INJECTION 76% COMPARISON:  None. FINDINGS: CTA NECK FINDINGS Bolus timing is suboptimal. Aortic arch: Standard 3 vessel aortic arch with widely patent arch vessel origins. Right carotid system: Patent without evidence of stenosis, dissection, or significant atherosclerosis. Left carotid system: Patent without evidence of stenosis, dissection, or significant atherosclerosis. Vertebral arteries: The vertebral arteries are patent and codominant with a normal appearance on the right. There is an abnormal peripheral, eccentric rim of soft tissue along the left V3 segment with moderate luminal narrowing greatest in the C1 foramen. Skeleton: No acute osseous abnormality or suspicious osseous lesion. Other neck: No evidence of acute abnormality or mass. Upper chest: Clear lung apices. Review of the MIP images confirms the above findings CTA HEAD FINDINGS Suboptimal arterial opacification limits assessment of the small and medium-sized vessels. Anterior circulation: The internal carotid arteries are patent from skull base to carotid termini without evidence of significant stenosis. ACAs and MCAs are patent without evidence of proximal branch occlusion. Assessment for stenosis is limited by image noise creating a diffusely irregular appearance of the vasculature however no convincing flow limiting A1 or M1 stenosis is evident. The right A1 segment is hypoplastic. No aneurysm is identified. Posterior circulation: The intracranial vertebral arteries are patent to the basilar. Patent PICA and SCA origins are identified bilaterally. The basilar artery is patent with mild diffuse irregularity which is likely artifactual and no evidence of significant stenosis. Both P1 and proximal P2 segments are patent with assessment for stenosis limited by image  noise. There is however a left PCA branch vessel occlusion at the P2 bifurcation. No aneurysm is identified. Venous sinuses: As permitted by contrast timing, patent. Anatomic variants: Hypoplastic right A1. Delayed phase: Not performed. Review of the MIP images confirms the above findings CT Brain Perfusion Findings: Perfusion imaging in this case is considered unreliable given the diffusely noisy appearance of the generated perfusion maps, although there is the suggestion of reduced CBV in the mesial left temporal and occipital lobes. IMPRESSION: 1. Abnormal appearance of the left V3 segment concerning for dissection. 2. Left P2 branch occlusion. 3. Widely patent cervical carotid arteries. 4. Poor quality perfusion imaging with extensive image noise. Initial study review was in person with Dr. Wilford Corner on 01/02/2018 at 2:18 pm with preliminary finding of no large vessel occlusion. 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 Wilford Corner at 2:39 pm. Electronically Signed   By: Sebastian Ache M.D.   On: 01/02/2018 15:00   Mr Shirlee Latch VH Contrast  Result Date: 01/03/2018 CLINICAL DATA:  Status post revascularization of the occluded LEFT PCA P1 segment, TICI3 revascularization. Reconstruction of the LEFT vertebral artery in its V2, V3, and V4 segments with a pipeline flow diverter. History of hypertension. EXAM: MRI HEAD WITHOUT CONTRAST MRA HEAD WITHOUT CONTRAST TECHNIQUE: Multiplanar, multiecho pulse sequences of the brain  and surrounding structures were obtained without intravenous contrast. Angiographic images of the head were obtained using MRA technique without contrast. COMPARISON:  CTA head neck 01/02/2018. MR brain 01/02/2018. Endovascular procedure 01/02/2018. FINDINGS: MRI HEAD FINDINGS Brain: Restricted diffusion, corresponding low ADC,involving much of the LEFT PCA territory, including posterior temporal and occipital lobe,  extending to LEFT thalamus consistent with acute infarction. Additional areas of restricted diffusion representing acute infarction include punctate RIGHT inferior occipital cortex, LEFT cerebellum roughly 2 cm, subcentimeter RIGHT pons, and LEFT frontal cortex. Within limits for assessment on MR, no reperfusion hemorrhage at this time. Mild swelling of the posterior temporal lobe on the LEFT, extending into the quadrigeminal plate cistern. No midline shift. Normal for age cerebral volume. Premature nonacute T2 and FLAIR hyperintensities in the white matter which could represent small vessel disease, vasculitis, complicated migraine, chronic infection, or idiopathic. Vascular: Reported separately. Skull and upper cervical spine: Normal marrow signal. Sinuses/Orbits: Minor chronic sinus disease.  Negative orbits. Other: None. MRA HEAD FINDINGS The internal carotid arteries are widely patent. Widely patent anterior circulation. The basilar artery is widely patent, LEFT vertebral dominant, but both contributing. No evidence for LEFT PCA stenosis or dissection. RIGHT posterior cerebral artery normal. Dominant RIGHT PICA. BILATERAL superior cerebral artery patency. LEFT AICA patent, RIGHT AICA not visualized. IMPRESSION: Primarily posterior circulation ischemia, with predominant involvement of the LEFT PCA, but additional involvement of LEFT cerebellum and RIGHT pons, similar to the prior MR 11/04/2017. Additional punctate areas of acute ischemia, for instance LEFT frontal, RIGHT occipital, were not present previously, but doubt clinically significant. Premature for age white matter changes, uncertain etiology. No reperfusion hemorrhage is evident with particular attention to the LEFT PCA territory. Wide patency of the endovascular treated areas on intracranial MRA, specifically LEFT vertebral and LEFT PCA. Electronically Signed   By: Elsie Stain M.D.   On: 01/03/2018 15:04   Mr Brain Wo Contrast  Result Date:  01/03/2018 CLINICAL DATA:  Status post revascularization of the occluded LEFT PCA P1 segment, TICI3 revascularization. Reconstruction of the LEFT vertebral artery in its V2, V3, and V4 segments with a pipeline flow diverter. History of hypertension. EXAM: MRI HEAD WITHOUT CONTRAST MRA HEAD WITHOUT CONTRAST TECHNIQUE: Multiplanar, multiecho pulse sequences of the brain and surrounding structures were obtained without intravenous contrast. Angiographic images of the head were obtained using MRA technique without contrast. COMPARISON:  CTA head neck 01/02/2018. MR brain 01/02/2018. Endovascular procedure 01/02/2018. FINDINGS: MRI HEAD FINDINGS Brain: Restricted diffusion, corresponding low ADC,involving much of the LEFT PCA territory, including posterior temporal and occipital lobe, extending to LEFT thalamus consistent with acute infarction. Additional areas of restricted diffusion representing acute infarction include punctate RIGHT inferior occipital cortex, LEFT cerebellum roughly 2 cm, subcentimeter RIGHT pons, and LEFT frontal cortex. Within limits for assessment on MR, no reperfusion hemorrhage at this time. Mild swelling of the posterior temporal lobe on the LEFT, extending into the quadrigeminal plate cistern. No midline shift. Normal for age cerebral volume. Premature nonacute T2 and FLAIR hyperintensities in the white matter which could represent small vessel disease, vasculitis, complicated migraine, chronic infection, or idiopathic. Vascular: Reported separately. Skull and upper cervical spine: Normal marrow signal. Sinuses/Orbits: Minor chronic sinus disease.  Negative orbits. Other: None. MRA HEAD FINDINGS The internal carotid arteries are widely patent. Widely patent anterior circulation. The basilar artery is widely patent, LEFT vertebral dominant, but both contributing. No evidence for LEFT PCA stenosis or dissection. RIGHT posterior cerebral artery normal. Dominant RIGHT PICA. BILATERAL superior  cerebral artery  patency. LEFT AICA patent, RIGHT AICA not visualized. IMPRESSION: Primarily posterior circulation ischemia, with predominant involvement of the LEFT PCA, but additional involvement of LEFT cerebellum and RIGHT pons, similar to the prior MR 11/04/2017. Additional punctate areas of acute ischemia, for instance LEFT frontal, RIGHT occipital, were not present previously, but doubt clinically significant. Premature for age white matter changes, uncertain etiology. No reperfusion hemorrhage is evident with particular attention to the LEFT PCA territory. Wide patency of the endovascular treated areas on intracranial MRA, specifically LEFT vertebral and LEFT PCA. Electronically Signed   By: Elsie Stain M.D.   On: 01/03/2018 15:04   Mr Brain Wo Contrast  Result Date: 01/02/2018 CLINICAL DATA:  Right-sided weakness, slurred speech, right homonymous hemianopia, and confusion. EXAM: MRI HEAD WITHOUT CONTRAST TECHNIQUE: Multiplanar, multiecho pulse sequences of the brain and surrounding structures were obtained without intravenous contrast. COMPARISON:  Head CT, CTA, and cerebral perfusion earlier today FINDINGS: Limited imaging consisting of axial and coronal diffusion and T2 FLAIR sequences were obtained at the direction of the attending stroke neurologist. There is an acute, moderately large left PCA infarct involving the mesial left temporal and occipital lobes. Infarct extends to the lateral aspect of the left thalamus. There is also a 2 cm acute infarct posteriorly in the left cerebral hemisphere, and there is a 5 mm acute infarct in the right pons. No gross intracranial hemorrhage is identified. There is abnormal FLAIR hyperintensity in the PCA at the P2 level with a branch vessel occlusion shown on earlier CTA. Scattered punctate foci of T2 hyperintensity are present in the cerebral white matter bilaterally, mild but abnormal for age. The ventricles and sulci are normal. Mucous retention cysts are  noted in the maxillary sinuses. IMPRESSION: 1. Moderately large acute left PCA infarct. 2. Smaller acute infarcts in the left cerebellum and right pons. 3. Mild cerebral white matter T2 signal changes, nonspecific. Considerations include early chronic small vessel ischemia, headaches, prior infection/inflammation, prior trauma, vasculitis, and less likely demyelinating disease. Electronically Signed   By: Sebastian Ache M.D.   On: 01/02/2018 15:06   Ct Cerebral Perfusion W Contrast  Result Date: 01/02/2018 CLINICAL DATA:  Right-sided weakness, slurred speech, right homonymous hemianopia, and confusion. EXAM: CT ANGIOGRAPHY HEAD AND NECK CT PERFUSION BRAIN TECHNIQUE: Multidetector CT imaging of the head and neck was performed using the standard protocol during bolus administration of intravenous contrast. Multiplanar CT image reconstructions and MIPs were obtained to evaluate the vascular anatomy. Carotid stenosis measurements (when applicable) are obtained utilizing NASCET criteria, using the distal internal carotid diameter as the denominator. Multiphase CT imaging of the brain was performed following IV bolus contrast injection. Subsequent parametric perfusion maps were calculated using RAPID software. CONTRAST:  ISOVUE-370 IOPAMIDOL (ISOVUE-370) INJECTION 76%; 40mL ISOVUE-370 IOPAMIDOL (ISOVUE-370) INJECTION 76% COMPARISON:  None. FINDINGS: CTA NECK FINDINGS Bolus timing is suboptimal. Aortic arch: Standard 3 vessel aortic arch with widely patent arch vessel origins. Right carotid system: Patent without evidence of stenosis, dissection, or significant atherosclerosis. Left carotid system: Patent without evidence of stenosis, dissection, or significant atherosclerosis. Vertebral arteries: The vertebral arteries are patent and codominant with a normal appearance on the right. There is an abnormal peripheral, eccentric rim of soft tissue along the left V3 segment with moderate luminal narrowing greatest in  the C1 foramen. Skeleton: No acute osseous abnormality or suspicious osseous lesion. Other neck: No evidence of acute abnormality or mass. Upper chest: Clear lung apices. Review of the MIP images confirms the above findings CTA  HEAD FINDINGS Suboptimal arterial opacification limits assessment of the small and medium-sized vessels. Anterior circulation: The internal carotid arteries are patent from skull base to carotid termini without evidence of significant stenosis. ACAs and MCAs are patent without evidence of proximal branch occlusion. Assessment for stenosis is limited by image noise creating a diffusely irregular appearance of the vasculature however no convincing flow limiting A1 or M1 stenosis is evident. The right A1 segment is hypoplastic. No aneurysm is identified. Posterior circulation: The intracranial vertebral arteries are patent to the basilar. Patent PICA and SCA origins are identified bilaterally. The basilar artery is patent with mild diffuse irregularity which is likely artifactual and no evidence of significant stenosis. Both P1 and proximal P2 segments are patent with assessment for stenosis limited by image noise. There is however a left PCA branch vessel occlusion at the P2 bifurcation. No aneurysm is identified. Venous sinuses: As permitted by contrast timing, patent. Anatomic variants: Hypoplastic right A1. Delayed phase: Not performed. Review of the MIP images confirms the above findings CT Brain Perfusion Findings: Perfusion imaging in this case is considered unreliable given the diffusely noisy appearance of the generated perfusion maps, although there is the suggestion of reduced CBV in the mesial left temporal and occipital lobes. IMPRESSION: 1. Abnormal appearance of the left V3 segment concerning for dissection. 2. Left P2 branch occlusion. 3. Widely patent cervical carotid arteries. 4. Poor quality perfusion imaging with extensive image noise. Initial study review was in person with  Dr. Wilford Corner on 01/02/2018 at 2:18 pm with preliminary finding of no large vessel occlusion. 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 Wilford Corner at 2:39 pm. Electronically Signed   By: Sebastian Ache M.D.   On: 01/02/2018 15:00   Ct Head Code Stroke Wo Contrast  Addendum Date: 01/02/2018   ADDENDUM REPORT: 01/02/2018 15:12 ADDENDUM: On further review, there is cortical hypoattenuation in the mesial left temporal lobe and occipital lobe which is consistent with an acute PCA infarct and confirmed on subsequent MRI. Hyperdense focus in the left ambient cistern likely reflects an acute P2 embolus. Electronically Signed   By: Sebastian Ache M.D.   On: 01/02/2018 15:12   Result Date: 01/02/2018 CLINICAL DATA:  Code stroke. Right-sided weakness, slurred speech, right homonymous hemianopia, and confusion. EXAM: CT HEAD WITHOUT CONTRAST TECHNIQUE: Contiguous axial images were obtained from the base of the skull through the vertex without intravenous contrast. COMPARISON:  None. FINDINGS: Brain: There is no evidence of acute infarct, intracranial hemorrhage, mass, midline shift, or extra-axial fluid collection. The ventricles and sulci are normal. Vascular: No suspicious vessel hyperdensity. Skull: No fracture or focal osseous lesion. Sinuses/Orbits: Small bilateral maxillary sinus mucous retention cysts. Clear mastoid air cells. Unremarkable orbits. Other: None. ASPECTS [facility] Stroke Program Early CT Score) - Ganglionic level infarction (caudate, lentiform nuclei, internal capsule, insula, M1-M3 cortex): 7 - Supraganglionic infarction (M4-M6 cortex): 3 Total score (0-10 with 10 being normal): 10 IMPRESSION: 1. Unremarkable CT appearance of the brain. 2. ASPECTS is 10. The study was reviewed in person with Dr. Wilford Corner on 01/02/2018 at 2:18 p.m. Electronically Signed: By: Sebastian Ache M.D. On: 01/02/2018 14:30      Cerebral Angiogram S/P  Bilateral vert, bilateral common carotid arteriogram followed by complete revascularization of occluded Lt PCA P1 with 1 pass embotrap 5 mmx 33 mm retriever device achieving a TICI 3 revascularization and reconstructing acutely dissected Lt VA at V2 ,V3 and V4  segs with string sign stenosis, with x 3 pipeline flow diverter devices.   TTE  - Left ventricle: The cavity size was normal. There was mildconcentric hypertrophy. Systolic function was normal. Theestimated ejection fraction was in the range of 55% to 60%. Wallmotion was normal; there were no regional wall motionabnormalities. Left ventricular diastolic function parameterswere normal. - Left atrium: ? density in LA vs. artifact. The atrium was mildlydilated. - Pulmonary arteries: Systolic pressure could not be accuratelyestimated. Impressions:   ? density in LA vs. artifact. Recommend TEE for furtherevaluation given CVA.  TEE No significant findings  LUE doppler There is no obvious evidence of DVT or SVT noted in the left upper extremity.    HISTORY OF PRESENT ILLNESS Gary Steele is a 37 y.o. male, who is visiting [place] from out of town for a work meeting, with past medical history of hypertension, who was in his usual state of health till about 1 PM on 01/02/2018 when he suddenly started noticing blurred vision.  Soon after his colleagues noted that his speech was slurred and EMS was called immediately and he was brought into the emergency room for evaluation. On initial evaluation, he had a bizarre looking, nearly functional exam with possible right hemianopsia but he also reported blurred vision on his left visual field. He was able to name some objects but not able to read the cards. Noncontrast CT of the head was performed which did not show any acute changes.  CT angiogram of the head and neck was performed and on first glance and preliminary review did not show any acute abnormalities. Due to the inconsistent exam  and no certain findings on the CT and CTA initially, he was taken for a stat MRI of the brain that showed DWI changes in the left PCA territory.  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 IV TPA was not initially started because of inconsistent exam and preliminary findings of normal imaging, but with the abnormalities on CT as well as early DWI changes, TPA was started with some delay. Following IV TPA demonstration, he was taken into the IR suite for endovascular thrombectomy.  Upon obtaining more history from him as we are waiting in the IR suite, he said that he woke up with some neck discomfort this morning but had no neurological symptoms that were focal.  The focal neurological symptoms including blurred vision and slurred speech all started after 1 PM. He has never had similar symptoms in the past.  He has not had chiropractic maneuver.  He has not had any whiplash injury to the neck. Premorbid modified Rankin scale (mRS):0.   HOSPITAL COURSE Gary Steele is a 37 y.o. male with history of HTN admitted for blurry vision, slurry speech, right hemianopia. tPA given.    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 New L frontal and R occipital infarcts on repeat MRI likely related to cerebral angio procedure.  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 New L frontal and R occipital infarcts. Premature WMD. No reperfusion  hmg. Patent L VA and L PCA  2D Echo  EF 55-60%. ? LA density vs artifact  TEE done given LV density vs artifact - no significant findings  LDL 71  HgbA1c 5.2  UDS neg  No antithrombotic prior to admission, now on aspirin 81 mg daily and brilinta. Continue DAPT for at least 3 months.  Therapy recommendations:  OP PT and OT  Disposition:  home   Left VA dissection  Denies head trauma, neck manipulation or abrupt movement  Admitted for frequent neck cracking maneuver at home  S/p pipeline stenting  On ASA and brilinta and high dose lipitor  Hypertension  Stable  Treated with cleviprex in hospital post stent placement  BP goal normotensive  Hyperlipidemia  Home meds:  none   LDL - initially NTC due to high TG 1868 while on Cleviprex - rechecked LDL at 71, goal < 70  TG falsely high, d/c'd fenofibrate  Decreased lipitor from 80 to 40 mg daily   Continue statin and monitor at time of followup  Other Stroke Risk Factors  ETOH use - 3-4 drinks of alcohol every night  Other Active Problems  L hand edema - ? Etiology. Edema decreasing but still significant. Good blood flow. Good ROM. Venous doppler neg for SVT/DVT   DISCHARGE EXAM Blood pressure 132/83, pulse 91, temperature 97.8 F (36.6 C), temperature source Oral, resp. rate 16, weight 126.9 kg, SpO2 97 %. General - Well nourished, well developed, in no apparent distress. Cardiovascular - Regular rate and rhythm. Extremities - L hand edema decreased from yesterday but still present, progressive ecchymosis (blue/purple) inner wrist. 2+ radial pulse, 2 sec capillary refill, no pain, improved but still limited ROM only by edema  Mental Status -  Level of arousal and orientation to time, place, and person were intact. Language including expression, naming, repetition, comprehension was assessed and found intact. Attention span and concentration were normal. Fund of Knowledge was assessed and was  intact.  Cranial Nerves II - XII - II - Visual field right upper quadrantanopia, up close central vision deficit. III, IV, VI - Extraocular movements intact. V - Facial sensation intact bilaterally. VII - Facial movement intact bilaterally. VIII - Hearing & vestibular intact bilaterally. X - Palate elevates symmetrically. XI - Chin turning & shoulder shrug intact bilaterally. XII - Tongue protrusion intact.  Motor Strength - The patient's strength was normal in all extremities and pronator drift was absent.  Bulk was normal and fasciculations were absent.   Motor Tone - Muscle tone was assessed at the neck and appendages and was normal. Reflexes - The patient's reflexes were symmetrical in all extremities and he had no pathological reflexes. Sensory - Light touch, temperature/pinprick were assessed and were decreased on the right, with 70% of the left.   Coordination - The patient had normal movements in the hands with no ataxia or dysmetria.  Tremor was absent. Gait and Station - steady in room with furniture walk  Discharge Diet   heart healthy/carb modified thin liquids  DISCHARGE PLAN  Disposition:  Home   Outpatient PT and OT  Stay out of work for the next month. Anticipate ok to return to work 01/29/2018 pending therapy progress  aspirin 81 mg daily and Brilinta 90 mg bid for secondary stroke prevention s/p L VA stent placement for at least 3 months  Ongoing  risk factor control by Primary Care Physician at time of discharge  Follow-up primary care provider in 2 weeks.  Follow-up with neurology in 4 weeks, Pt/Family request followup with DR. Orvis Brill at [facility] neurology. Office not open day of d/c in order to request appt. Unable to forward d/c summary to MD via Epic as MD not listed [phone]). May follow up with  [facility] if interested, may contact office to schedule an appointment.   45 minutes were spent preparing  discharge.  Annie Main, MSN, APRN, ANVP-BC, AGPCNP-BC Advanced Practice Stroke Nurse [facility] See Amion for Schedule & Pager information 01/04/2018 1:46 PM    ATTENDING NOTE: I reviewed above note and agree with the assessment and plan. Pt was seen and examined.   No acute event overnight.  Neuro stable.  Left hand swelling improved, less painful.  Venous Doppler did not show any DVT or SVT.  Radial pulse strong and the capillary filling normal.  TTE x-ray concerning for left atrium density versus artifact.  TEE done today showed no LA thrombus, no PFO.  Continue aspirin and Brilinta as well as statin for left VA dissection status post stenting.  Patient will follow-up with local neurologist and Dr. Corliss Skains.  Ready for discharge.  Marvel Plan, MD PhD Stroke Neurology 01/05/2018 5:51 PM

## 2018-01-04 NOTE — Progress Notes (Addendum)
STROKE TEAM PROGRESS NOTE   SUBJECTIVE (INTERVAL HISTORY) His mother and father are at the bedside.  Overall he feels his R sided numbness condition is continuing to improve. 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 Today he complains of L hand edema/swelling, throbbing. A Line removed yest am. Has good capillary fill, pulses.    OBJECTIVE Temp:  [98.3 F (36.8 C)-98.8 F (37.1 C)] 98.6 F (37 C) (10/10 0754) Pulse Rate:  [75-93] 91 (10/10 1000) Cardiac Rhythm: Normal sinus rhythm (10/10 0800) Resp:  [11-22] 16 (10/10 1000) BP: (98-138)/(44-119) 128/91 (10/10 1000) SpO2:  [95 %-99 %] 96 % (10/10 1000)  Recent Labs  Lab 01/02/18 1349  GLUCAP 109*   Recent Labs  Lab 01/02/18 1350 01/02/18 1356 01/03/18 0500 01/04/18 0406  NA 140 140 135 140  K 3.1* 3.2* 4.9 3.6  CL 104 105 105 109  CO2 22  --  20* 23  GLUCOSE 120* 122* 110* 106*  BUN 12 14 7 7   CREATININE 0.85 0.80 0.43* 0.79  CALCIUM 9.1  --  8.2* 8.7*   Recent Labs  Lab 01/02/18 1350  AST 27  ALT 34  ALKPHOS 58  BILITOT 1.0  PROT 7.2  ALBUMIN 4.2   Recent Labs  Lab 01/02/18 1350 01/02/18 1356 01/03/18 0500  WBC 8.7  --  9.5  NEUTROABS 5.6  --  8.3*  HGB 15.6 15.6 13.1  HCT 46.1 46.0 39.5  MCV 91.1  --  90.6  PLT 242  --  345    Recent Labs    01/02/18 1350  LABPROT 13.5  INR 1.04   No results for input(s): COLORURINE, LABSPEC, PHURINE, GLUCOSEU, HGBUR, BILIRUBINUR, KETONESUR, PROTEINUR, UROBILINOGEN, NITRITE, LEUKOCYTESUR in the last 72 hours.  Invalid input(s): APPERANCEUR     Component Value Date/Time   CHOL 141 01/04/2018 0406   TRIG 148 01/04/2018 0406   HDL 40 (L) 01/04/2018 0406   CHOLHDL 3.5 01/04/2018 0406   VLDL 30 01/04/2018 0406   LDLCALC 71 01/04/2018 0406   Lab Results  Component Value Date   HGBA1C 5.2 01/03/2018      Component Value Date/Time   LABOPIA NONE DETECTED 01/02/2018 2013   COCAINSCRNUR NONE DETECTED 01/02/2018 2013   LABBENZ NONE DETECTED 01/02/2018 2013    AMPHETMU NONE DETECTED 01/02/2018 2013   THCU NONE DETECTED 01/02/2018 2013   LABBARB NONE DETECTED 01/02/2018 2013    No results for input(s): ETH in the last 168 hours.  Imaging  Ct Angio Head W Or Wo Contrast  Result Date: 01/02/2018 CLINICAL DATA:  Right-sided weakness, slurred speech, right homonymous hemianopia, and confusion. EXAM: CT ANGIOGRAPHY HEAD AND NECK CT PERFUSION BRAIN TECHNIQUE: Multidetector CT imaging of the head and neck was performed using the standard protocol during bolus administration of intravenous contrast. Multiplanar CT image reconstructions and MIPs were obtained to evaluate the vascular anatomy. Carotid stenosis measurements (when applicable) are obtained utilizing NASCET criteria, using the distal internal carotid diameter as the denominator. Multiphase CT imaging of the brain was performed following IV bolus contrast injection. Subsequent parametric perfusion maps were calculated using RAPID software. CONTRAST:  ISOVUE-370 IOPAMIDOL (ISOVUE-370) INJECTION 76%; 40mL ISOVUE-370 IOPAMIDOL (ISOVUE-370) INJECTION 76% COMPARISON:  None. FINDINGS: CTA NECK FINDINGS Bolus timing is suboptimal. Aortic arch: Standard 3 vessel aortic arch with widely patent arch vessel origins. Right carotid system: Patent without evidence of stenosis, dissection, or significant atherosclerosis. Left carotid system: Patent without evidence of stenosis, dissection, or significant atherosclerosis. Vertebral arteries: The vertebral arteries are  patent and codominant with a normal appearance on the right. There is an abnormal peripheral, eccentric rim of soft tissue along the left V3 segment with moderate luminal narrowing greatest in the C1 foramen. Skeleton: No acute osseous abnormality or suspicious osseous lesion. Other neck: No evidence of acute abnormality or mass. Upper chest: Clear lung apices. Review of the MIP images confirms the above findings CTA HEAD FINDINGS Suboptimal arterial  opacification limits assessment of the small and medium-sized vessels. Anterior circulation: The internal carotid arteries are patent from skull base to carotid termini without evidence of significant stenosis. ACAs and MCAs are patent without evidence of proximal branch occlusion. Assessment for stenosis is limited by image noise creating a diffusely irregular appearance of the vasculature however no convincing flow limiting A1 or M1 stenosis is evident. The right A1 segment is hypoplastic. No aneurysm is identified. Posterior circulation: The intracranial vertebral arteries are patent to the basilar. Patent PICA and SCA origins are identified bilaterally. The basilar artery is patent with mild diffuse irregularity which is likely artifactual and no evidence of significant stenosis. Both P1 and proximal P2 segments are patent with assessment for stenosis limited by image noise. There is however a left PCA branch vessel occlusion at the P2 bifurcation. No aneurysm is identified. Venous sinuses: As permitted by contrast timing, patent. Anatomic variants: Hypoplastic right A1. Delayed phase: Not performed. Review of the MIP images confirms the above findings CT Brain Perfusion Findings: Perfusion imaging in this case is considered unreliable given the diffusely noisy appearance of the generated perfusion maps, although there is the suggestion of reduced CBV in the mesial left temporal and occipital lobes. IMPRESSION: 1. Abnormal appearance of the left V3 segment concerning for dissection. 2. Left P2 branch occlusion. 3. Widely patent cervical carotid arteries. 4. Poor quality perfusion imaging with extensive image noise. Initial study review was in person with Dr. Wilford Corner on 01/02/2018 at 2:18 pm with preliminary finding of no large vessel occlusion. 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 Wilford Corner at 2:39 pm. Electronically Signed    By: Sebastian Ache M.D.   On: 01/02/2018 15:00   Ct Angio Neck W Or Wo Contrast  Result Date: 01/02/2018 CLINICAL DATA:  Right-sided weakness, slurred speech, right homonymous hemianopia, and confusion. EXAM: CT ANGIOGRAPHY HEAD AND NECK CT PERFUSION BRAIN TECHNIQUE: Multidetector CT imaging of the head and neck was performed using the standard protocol during bolus administration of intravenous contrast. Multiplanar CT image reconstructions and MIPs were obtained to evaluate the vascular anatomy. Carotid stenosis measurements (when applicable) are obtained utilizing NASCET criteria, using the distal internal carotid diameter as the denominator. Multiphase CT imaging of the brain was performed following IV bolus contrast injection. Subsequent parametric perfusion maps were calculated using RAPID software. CONTRAST:  ISOVUE-370 IOPAMIDOL (ISOVUE-370) INJECTION 76%; 40mL ISOVUE-370 IOPAMIDOL (ISOVUE-370) INJECTION 76% COMPARISON:  None. FINDINGS: CTA NECK FINDINGS Bolus timing is suboptimal. Aortic arch: Standard 3 vessel aortic arch with widely patent arch vessel origins. Right carotid system: Patent without evidence of stenosis, dissection, or significant atherosclerosis. Left carotid system: Patent without evidence of stenosis, dissection, or significant atherosclerosis. Vertebral arteries: The vertebral arteries are patent and codominant with a normal appearance on the right. There is an abnormal peripheral, eccentric rim of soft tissue along the left V3 segment with moderate luminal narrowing greatest in the C1 foramen. Skeleton: No acute osseous abnormality or suspicious osseous lesion. Other neck: No evidence of  acute abnormality or mass. Upper chest: Clear lung apices. Review of the MIP images confirms the above findings CTA HEAD FINDINGS Suboptimal arterial opacification limits assessment of the small and medium-sized vessels. Anterior circulation: The internal carotid arteries are patent from skull  base to carotid termini without evidence of significant stenosis. ACAs and MCAs are patent without evidence of proximal branch occlusion. Assessment for stenosis is limited by image noise creating a diffusely irregular appearance of the vasculature however no convincing flow limiting A1 or M1 stenosis is evident. The right A1 segment is hypoplastic. No aneurysm is identified. Posterior circulation: The intracranial vertebral arteries are patent to the basilar. Patent PICA and SCA origins are identified bilaterally. The basilar artery is patent with mild diffuse irregularity which is likely artifactual and no evidence of significant stenosis. Both P1 and proximal P2 segments are patent with assessment for stenosis limited by image noise. There is however a left PCA branch vessel occlusion at the P2 bifurcation. No aneurysm is identified. Venous sinuses: As permitted by contrast timing, patent. Anatomic variants: Hypoplastic right A1. Delayed phase: Not performed. Review of the MIP images confirms the above findings CT Brain Perfusion Findings: Perfusion imaging in this case is considered unreliable given the diffusely noisy appearance of the generated perfusion maps, although there is the suggestion of reduced CBV in the mesial left temporal and occipital lobes. IMPRESSION: 1. Abnormal appearance of the left V3 segment concerning for dissection. 2. Left P2 branch occlusion. 3. Widely patent cervical carotid arteries. 4. Poor quality perfusion imaging with extensive image noise. Initial study review was in person with Dr. Wilford Corner on 01/02/2018 at 2:18 pm with preliminary finding of no large vessel occlusion. 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 Wilford Corner at 2:39 pm. Electronically Signed   By: Sebastian Ache M.D.   On: 01/02/2018 15:00   Mr Shirlee Latch ZO Contrast  Result Date: 01/03/2018 CLINICAL DATA:  Status post revascularization  of the occluded LEFT PCA P1 segment, TICI3 revascularization. Reconstruction of the LEFT vertebral artery in its V2, V3, and V4 segments with a pipeline flow diverter. History of hypertension. EXAM: MRI HEAD WITHOUT CONTRAST MRA HEAD WITHOUT CONTRAST TECHNIQUE: Multiplanar, multiecho pulse sequences of the brain and surrounding structures were obtained without intravenous contrast. Angiographic images of the head were obtained using MRA technique without contrast. COMPARISON:  CTA head neck 01/02/2018. MR brain 01/02/2018. Endovascular procedure 01/02/2018. FINDINGS: MRI HEAD FINDINGS Brain: Restricted diffusion, corresponding low ADC,involving much of the LEFT PCA territory, including posterior temporal and occipital lobe, extending to LEFT thalamus consistent with acute infarction. Additional areas of restricted diffusion representing acute infarction include punctate RIGHT inferior occipital cortex, LEFT cerebellum roughly 2 cm, subcentimeter RIGHT pons, and LEFT frontal cortex. Within limits for assessment on MR, no reperfusion hemorrhage at this time. Mild swelling of the posterior temporal lobe on the LEFT, extending into the quadrigeminal plate cistern. No midline shift. Normal for age cerebral volume. Premature nonacute T2 and FLAIR hyperintensities in the white matter which could represent small vessel disease, vasculitis, complicated migraine, chronic infection, or idiopathic. Vascular: Reported separately. Skull and upper cervical spine: Normal marrow signal. Sinuses/Orbits: Minor chronic sinus disease.  Negative orbits. Other: None. MRA HEAD FINDINGS The internal carotid arteries are widely patent. Widely patent anterior circulation. The basilar artery is widely patent, LEFT vertebral dominant, but both contributing. No evidence for LEFT PCA stenosis or dissection. RIGHT posterior cerebral artery normal. Dominant RIGHT  PICA. BILATERAL superior cerebral artery patency. LEFT AICA patent, RIGHT AICA not  visualized. IMPRESSION: Primarily posterior circulation ischemia, with predominant involvement of the LEFT PCA, but additional involvement of LEFT cerebellum and RIGHT pons, similar to the prior MR 11/04/2017. Additional punctate areas of acute ischemia, for instance LEFT frontal, RIGHT occipital, were not present previously, but doubt clinically significant. Premature for age white matter changes, uncertain etiology. No reperfusion hemorrhage is evident with particular attention to the LEFT PCA territory. Wide patency of the endovascular treated areas on intracranial MRA, specifically LEFT vertebral and LEFT PCA. Electronically Signed   By: Elsie Stain M.D.   On: 01/03/2018 15:04   Mr Brain Wo Contrast  Result Date: 01/03/2018 CLINICAL DATA:  Status post revascularization of the occluded LEFT PCA P1 segment, TICI3 revascularization. Reconstruction of the LEFT vertebral artery in its V2, V3, and V4 segments with a pipeline flow diverter. History of hypertension. EXAM: MRI HEAD WITHOUT CONTRAST MRA HEAD WITHOUT CONTRAST TECHNIQUE: Multiplanar, multiecho pulse sequences of the brain and surrounding structures were obtained without intravenous contrast. Angiographic images of the head were obtained using MRA technique without contrast. COMPARISON:  CTA head neck 01/02/2018. MR brain 01/02/2018. Endovascular procedure 01/02/2018. FINDINGS: MRI HEAD FINDINGS Brain: Restricted diffusion, corresponding low ADC,involving much of the LEFT PCA territory, including posterior temporal and occipital lobe, extending to LEFT thalamus consistent with acute infarction. Additional areas of restricted diffusion representing acute infarction include punctate RIGHT inferior occipital cortex, LEFT cerebellum roughly 2 cm, subcentimeter RIGHT pons, and LEFT frontal cortex. Within limits for assessment on MR, no reperfusion hemorrhage at this time. Mild swelling of the posterior temporal lobe on the LEFT, extending into the  quadrigeminal plate cistern. No midline shift. Normal for age cerebral volume. Premature nonacute T2 and FLAIR hyperintensities in the white matter which could represent small vessel disease, vasculitis, complicated migraine, chronic infection, or idiopathic. Vascular: Reported separately. Skull and upper cervical spine: Normal marrow signal. Sinuses/Orbits: Minor chronic sinus disease.  Negative orbits. Other: None. MRA HEAD FINDINGS The internal carotid arteries are widely patent. Widely patent anterior circulation. The basilar artery is widely patent, LEFT vertebral dominant, but both contributing. No evidence for LEFT PCA stenosis or dissection. RIGHT posterior cerebral artery normal. Dominant RIGHT PICA. BILATERAL superior cerebral artery patency. LEFT AICA patent, RIGHT AICA not visualized. IMPRESSION: Primarily posterior circulation ischemia, with predominant involvement of the LEFT PCA, but additional involvement of LEFT cerebellum and RIGHT pons, similar to the prior MR 11/04/2017. Additional punctate areas of acute ischemia, for instance LEFT frontal, RIGHT occipital, were not present previously, but doubt clinically significant. Premature for age white matter changes, uncertain etiology. No reperfusion hemorrhage is evident with particular attention to the LEFT PCA territory. Wide patency of the endovascular treated areas on intracranial MRA, specifically LEFT vertebral and LEFT PCA. Electronically Signed   By: Elsie Stain M.D.   On: 01/03/2018 15:04   Mr Brain Wo Contrast  Result Date: 01/02/2018 CLINICAL DATA:  Right-sided weakness, slurred speech, right homonymous hemianopia, and confusion. EXAM: MRI HEAD WITHOUT CONTRAST TECHNIQUE: Multiplanar, multiecho pulse sequences of the brain and surrounding structures were obtained without intravenous contrast. COMPARISON:  Head CT, CTA, and cerebral perfusion earlier today FINDINGS: Limited imaging consisting of axial and coronal diffusion and T2 FLAIR  sequences were obtained at the direction of the attending stroke neurologist. There is an acute, moderately large left PCA infarct involving the mesial left temporal and occipital lobes. Infarct extends to the lateral aspect of the left thalamus.  There is also a 2 cm acute infarct posteriorly in the left cerebral hemisphere, and there is a 5 mm acute infarct in the right pons. No gross intracranial hemorrhage is identified. There is abnormal FLAIR hyperintensity in the PCA at the P2 level with a branch vessel occlusion shown on earlier CTA. Scattered punctate foci of T2 hyperintensity are present in the cerebral white matter bilaterally, mild but abnormal for age. The ventricles and sulci are normal. Mucous retention cysts are noted in the maxillary sinuses. IMPRESSION: 1. Moderately large acute left PCA infarct. 2. Smaller acute infarcts in the left cerebellum and right pons. 3. Mild cerebral white matter T2 signal changes, nonspecific. Considerations include early chronic small vessel ischemia, headaches, prior infection/inflammation, prior trauma, vasculitis, and less likely demyelinating disease. Electronically Signed   By: Sebastian Ache M.D.   On: 01/02/2018 15:06   Ct Cerebral Perfusion W Contrast  Result Date: 01/02/2018 CLINICAL DATA:  Right-sided weakness, slurred speech, right homonymous hemianopia, and confusion. EXAM: CT ANGIOGRAPHY HEAD AND NECK CT PERFUSION BRAIN TECHNIQUE: Multidetector CT imaging of the head and neck was performed using the standard protocol during bolus administration of intravenous contrast. Multiplanar CT image reconstructions and MIPs were obtained to evaluate the vascular anatomy. Carotid stenosis measurements (when applicable) are obtained utilizing NASCET criteria, using the distal internal carotid diameter as the denominator. Multiphase CT imaging of the brain was performed following IV bolus contrast injection. Subsequent parametric perfusion maps were calculated using  RAPID software. CONTRAST:  ISOVUE-370 IOPAMIDOL (ISOVUE-370) INJECTION 76%; 40mL ISOVUE-370 IOPAMIDOL (ISOVUE-370) INJECTION 76% COMPARISON:  None. FINDINGS: CTA NECK FINDINGS Bolus timing is suboptimal. Aortic arch: Standard 3 vessel aortic arch with widely patent arch vessel origins. Right carotid system: Patent without evidence of stenosis, dissection, or significant atherosclerosis. Left carotid system: Patent without evidence of stenosis, dissection, or significant atherosclerosis. Vertebral arteries: The vertebral arteries are patent and codominant with a normal appearance on the right. There is an abnormal peripheral, eccentric rim of soft tissue along the left V3 segment with moderate luminal narrowing greatest in the C1 foramen. Skeleton: No acute osseous abnormality or suspicious osseous lesion. Other neck: No evidence of acute abnormality or mass. Upper chest: Clear lung apices. Review of the MIP images confirms the above findings CTA HEAD FINDINGS Suboptimal arterial opacification limits assessment of the small and medium-sized vessels. Anterior circulation: The internal carotid arteries are patent from skull base to carotid termini without evidence of significant stenosis. ACAs and MCAs are patent without evidence of proximal branch occlusion. Assessment for stenosis is limited by image noise creating a diffusely irregular appearance of the vasculature however no convincing flow limiting A1 or M1 stenosis is evident. The right A1 segment is hypoplastic. No aneurysm is identified. Posterior circulation: The intracranial vertebral arteries are patent to the basilar. Patent PICA and SCA origins are identified bilaterally. The basilar artery is patent with mild diffuse irregularity which is likely artifactual and no evidence of significant stenosis. Both P1 and proximal P2 segments are patent with assessment for stenosis limited by image noise. There is however a left PCA branch vessel occlusion at  the P2 bifurcation. No aneurysm is identified. Venous sinuses: As permitted by contrast timing, patent. Anatomic variants: Hypoplastic right A1. Delayed phase: Not performed. Review of the MIP images confirms the above findings CT Brain Perfusion Findings: Perfusion imaging in this case is considered unreliable given the diffusely noisy appearance of the generated perfusion maps, although there is the suggestion of reduced CBV in the  mesial left temporal and occipital lobes. IMPRESSION: 1. Abnormal appearance of the left V3 segment concerning for dissection. 2. Left P2 branch occlusion. 3. Widely patent cervical carotid arteries. 4. Poor quality perfusion imaging with extensive image noise. Initial study review was in person with Dr. Wilford Corner on 01/02/2018 at 2:18 pm with preliminary finding of no large vessel occlusion. 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 Wilford Corner at 2:39 pm. Electronically Signed   By: Sebastian Ache M.D.   On: 01/02/2018 15:00   Ct Head Code Stroke Wo Contrast  Addendum Date: 01/02/2018   ADDENDUM REPORT: 01/02/2018 15:12 ADDENDUM: On further review, there is cortical hypoattenuation in the mesial left temporal lobe and occipital lobe which is consistent with an acute PCA infarct and confirmed on subsequent MRI. Hyperdense focus in the left ambient cistern likely reflects an acute P2 embolus. Electronically Signed   By: Sebastian Ache M.D.   On: 01/02/2018 15:12   Result Date: 01/02/2018 CLINICAL DATA:  Code stroke. Right-sided weakness, slurred speech, right homonymous hemianopia, and confusion. EXAM: CT HEAD WITHOUT CONTRAST TECHNIQUE: Contiguous axial images were obtained from the base of the skull through the vertex without intravenous contrast. COMPARISON:  None. FINDINGS: Brain: There is no evidence of acute infarct, intracranial hemorrhage, mass, midline shift, or extra-axial fluid collection. The  ventricles and sulci are normal. Vascular: No suspicious vessel hyperdensity. Skull: No fracture or focal osseous lesion. Sinuses/Orbits: Small bilateral maxillary sinus mucous retention cysts. Clear mastoid air cells. Unremarkable orbits. Other: None. ASPECTS [facility] Stroke Program Early CT Score) - Ganglionic level infarction (caudate, lentiform nuclei, internal capsule, insula, M1-M3 cortex): 7 - Supraganglionic infarction (M4-M6 cortex): 3 Total score (0-10 with 10 being normal): 10 IMPRESSION: 1. Unremarkable CT appearance of the brain. 2. ASPECTS is 10. The study was reviewed in person with Dr. Wilford Corner on 01/02/2018 at 2:18 p.m. Electronically Signed: By: Sebastian Ache M.D. On: 01/02/2018 14:30   TTE  - Left ventricle: The cavity size was normal. There was mild concentric hypertrophy. Systolic function was normal. The estimated ejection fraction was in the range of 55% to 60%. Wall motion was normal; there were no regional wall motion abnormalities. Left ventricular diastolic function parameters were normal. - Left atrium: ? density in LA vs. artifact. The atrium was mildly dilated. - Pulmonary arteries: Systolic pressure could not be accurately estimated. Impressions:   ? density in LA vs. artifact. Recommend TEE for further evaluation given CVA.   PHYSICAL EXAM  General - Well nourished, well developed, in no apparent distress.  Ophthalmologic - fundi not visualized due to noncooperation.  Cardiovascular - Regular rate and rhythm.  Extremities - L hand edema, ecchymosis (blue/purple) inner wrist. 2+ radial pulse, 2sec capillary refill, no pain, ROM limited only by edema  Mental Status -  Level of arousal and orientation to time, place, and person were intact. Language including expression, naming, repetition, comprehension was assessed and found intact. Attention span and concentration were normal. Fund of Knowledge was assessed and was intact.  Cranial Nerves II - XII - II - Visual  field right upper quadrantanopia. III, IV, VI - Extraocular movements intact. V - Facial sensation intact bilaterally. VII - Facial movement intact bilaterally. VIII - Hearing & vestibular intact bilaterally. X - Palate elevates symmetrically. XI - Chin turning & shoulder shrug intact bilaterally. XII - Tongue protrusion intact.  Motor Strength - The patient's strength was normal in all extremities and  pronator drift was absent.  Bulk was normal and fasciculations were absent.   Motor Tone - Muscle tone was assessed at the neck and appendages and was normal.  Reflexes - The patient's reflexes were symmetrical in all extremities and he had no pathological reflexes.  Sensory - Light touch, temperature/pinprick were assessed and were decreased on the right, with 70% of the left.    Coordination - The patient had normal movements in the hands with no ataxia or dysmetria.  Tremor was absent.  Gait and Station - deferred.   ASSESSMENT/PLAN Mr. Kipper Buch is a 36 y.o. male with history of HTN admitted for blurry vision, slurry speech, right hemianopia. tPA given.    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 New L frontal and R occipital infarcts. Premature WMD. No reperfusion hmg. Patent L VA and L PCA  2D Echo  EF 55-60%. ? LV density vs artifact  TEE given LV density, will do TEE to further evaluate. Have requested for Friday  LDL NTC due to high TG 1868 - rechecked off Cleviprex - LDL 71 - see below  HgbA1c 5.2  UDS  neg  SCDs for VTE prophylaxis  No antithrombotic prior to admission, now on aspirin 81 mg daily and brilinta. Continue at least 3 months due to stents  Patient counseled to be compliant with his antithrombotic medications  Ongoing aggressive stroke risk factor management  Therapy recommendations:  OP PT and OT  Disposition:  Anticipate d/c home Friday or Saturday  Transfer to floor  Left VA dissection  Denies head trauma, neck manipulation or abrupt movement  S/p pipeline stenting  On ASA and brilinta and high dose lipitor  Not sure if related to high TG and HTN  Possible LA density  2D Echo  EF 55-60%. ? LA density vs artifact  TEE given LA density, will do TEE to further evaluate. Have requested for Friday  Hypertension . Stable . Permissive hypertension (OK if <180/105) for 24-48 hours post stroke and then gradually normalized within 5-7 days. . Treated with cleviprex in hospital, now off  Long term BP goal normotensive  Hyperlipidemia  Home meds:  none   LDL - initially NTC due to high TG 1868 while on Clevipres - rechecked LDL at 71, goal < 70  TG falsely high, d/c'd fenofibrate  For now, continue high dose lipitor   Continue statin  Other Stroke Risk Factors  ETOH use - 3-4 drinks of alcohol every night - on FA/MVI/B1  Other Active Problems  L hand edema - ? Etiology. Good blood flow. Good ROM. Will check venous doppler  Hospital day # 2  Annie Main, MSN, APRN, ANVP-BC, AGPCNP-BC Advanced Practice Stroke Nurse [facility] See Amion for Schedule & Pager information 01/04/2018 11:02 AM   ATTENDING NOTE: I reviewed above note and agree with the assessment and plan. Pt was seen and examined.   Patient parents at bedside.  Overnight no acute issue, patient still complaining of left frontal  headache, responding to Tylenol.  Patient had left hand swelling and pain for sensation, likely due to previously placed a line.  Radial pulse  palpable and distal capillary filling intact.  No concern for arterial thrombosis at this time.  Venous Doppler of left hand showed no DVT or SVT.  Recommend to keep left hand higher position.  Exam still showed right upper quadrantanopia and slight right upper and lower extremity decreased sensation.  Continue aspirin and Brilinta due to stent.  Repeat lipid panel showed normal TG, DC fenofibrate.  Continue statin given recent stent.  TTE yesterday showed questionable left atrium density, will do TEE tomorrow to rule out.  PT/OT recommend outpatient PT/OT.    Marvel Plan, MD PhD Stroke Neurology 01/04/2018 4:03 PM     To contact Stroke Continuity provider, please refer to [email or web address]. After hours, contact General Neurology

## 2018-01-04 NOTE — Anesthesia Postprocedure Evaluation (Signed)
Anesthesia Post Note  Patient: Gary Steele  Procedure(s) Performed: IR WITH ANESTHESIA (N/A )     Patient location during evaluation: PACU Anesthesia Type: General Level of consciousness: awake and alert Pain management: pain level controlled Vital Signs Assessment: post-procedure vital signs reviewed and stable Respiratory status: spontaneous breathing, nonlabored ventilation, respiratory function stable and patient connected to nasal cannula oxygen Cardiovascular status: blood pressure returned to baseline and stable Postop Assessment: no apparent nausea or vomiting Anesthetic complications: no    Last Vitals:  Vitals:   01/04/18 0700 01/04/18 0754  BP: (!) 122/44   Pulse: 75   Resp:    Temp:  37 C  SpO2: 96%     Last Pain:  Vitals:   01/04/18 0754  TempSrc: Axillary  PainSc: 8                  Damel Querry S

## 2018-01-05 ENCOUNTER — Inpatient Hospital Stay (HOSPITAL_COMMUNITY): Payer: BLUE CROSS/BLUE SHIELD | Admitting: Certified Registered"

## 2018-01-05 ENCOUNTER — Encounter (HOSPITAL_COMMUNITY)
Admission: EM | Disposition: A | Discharge status: Home / Self Care | Payer: Self-pay | Source: Home / Self Care | Attending: Neurology

## 2018-01-05 ENCOUNTER — Other Ambulatory Visit: Payer: Self-pay

## 2018-01-05 ENCOUNTER — Encounter (HOSPITAL_COMMUNITY): Payer: Self-pay

## 2018-01-05 ENCOUNTER — Inpatient Hospital Stay (HOSPITAL_COMMUNITY): Payer: BLUE CROSS/BLUE SHIELD

## 2018-01-05 DIAGNOSIS — I10 Essential (primary) hypertension: Secondary | ICD-10-CM

## 2018-01-05 DIAGNOSIS — I639 Cerebral infarction, unspecified: Secondary | ICD-10-CM

## 2018-01-05 HISTORY — PX: TEE WITHOUT CARDIOVERSION: SHX5443

## 2018-01-05 SURGERY — ECHOCARDIOGRAM, TRANSESOPHAGEAL
Anesthesia: Monitor Anesthesia Care

## 2018-01-05 MED ORDER — PROPOFOL 10 MG/ML IV BOLUS
INTRAVENOUS | Status: DC | PRN
  Administered 2018-01-05: 50 mg via INTRAVENOUS
  Administered 2018-01-05: 75 mg via INTRAVENOUS
  Administered 2018-01-05: 40 mg via INTRAVENOUS

## 2018-01-05 MED ORDER — LACTATED RINGERS IV SOLN
INTRAVENOUS | Status: DC | PRN
  Administered 2018-01-05: 13:00:00 via INTRAVENOUS

## 2018-01-05 MED ORDER — ATORVASTATIN CALCIUM 40 MG PO TABS
40.0000 mg | ORAL_TABLET | Freq: Every day | ORAL | Status: DC
  Administered 2018-01-05: 40 mg via ORAL
  Filled 2018-01-05: qty 1

## 2018-01-05 MED ORDER — PROPOFOL 500 MG/50ML IV EMUL
INTRAVENOUS | Status: DC | PRN
  Administered 2018-01-05: 150 ug/kg/min via INTRAVENOUS
  Administered 2018-01-05: 14:00:00 via INTRAVENOUS

## 2018-01-05 MED ORDER — TICAGRELOR 90 MG PO TABS: 90.0000 mg | ORAL_TABLET | Freq: Two times a day (BID) | ORAL | 2 refills | Status: AC

## 2018-01-05 MED ORDER — ASPIRIN 81 MG PO CHEW: 81.0000 mg | CHEWABLE_TABLET | Freq: Every day | ORAL | Status: AC

## 2018-01-05 MED ORDER — BISACODYL 10 MG RE SUPP
10.0000 mg | Freq: Every day | RECTAL | Status: DC | PRN
  Administered 2018-01-05: 10 mg via RECTAL
  Filled 2018-01-05: qty 1

## 2018-01-05 MED ORDER — PROSIGHT PO TABS: 1.0000 | ORAL_TABLET | Freq: Every day | ORAL | 0 refills | Status: DC

## 2018-01-05 MED ORDER — ATORVASTATIN CALCIUM 40 MG PO TABS: 40.0000 mg | ORAL_TABLET | Freq: Every day | ORAL | 2 refills | Status: AC

## 2018-01-05 MED ORDER — LIDOCAINE 2% (20 MG/ML) 5 ML SYRINGE
INTRAMUSCULAR | Status: DC | PRN
  Administered 2018-01-05: 100 mg via INTRAVENOUS

## 2018-01-05 NOTE — Progress Notes (Signed)
Patient discharged in stable condition with all belongings and wife at bedside. They both verbalized understanding of all discharge instructions and importance of follow up visits.

## 2018-01-05 NOTE — Progress Notes (Signed)
Physical Therapy Treatment Patient Details Name: Gary Steele MRN: [id number] DOB: 05/25/1980 Today's Date: 01/05/2018    History of Present Illness Pt is a 37 y.o. male, who is visiting  from out of town for a work meeting and began having R sided weakness, numbness, difficulty with speech and blurred vision. Suspected stroke s/p IV tPA.  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  Pt with no other significant PMH.      PT Comments    Pt making steady progress with functional mobility and improving with higher level balance tasks. Pt would continue to benefit from skilled physical therapy services at this time while admitted and after d/c to address the below listed limitations in order to improve overall safety and independence with functional mobility.    Follow Up Recommendations  Outpatient PT;Supervision for mobility/OOB     Equipment Recommendations  None recommended by PT    Recommendations for Other Services       Precautions / Restrictions Precautions Precautions: Fall Restrictions Weight Bearing Restrictions: No    Mobility  Bed Mobility Overal bed mobility: Modified Independent                Transfers Overall transfer level: Needs assistance Equipment used: None Transfers: Sit to/from Stand Sit to Stand: Supervision         General transfer comment: for safety  Ambulation/Gait Ambulation/Gait assistance: Min guard;Supervision Gait Distance (Feet): 400 Feet Assistive device: None Gait Pattern/deviations: Step-through pattern;Decreased stride length Gait velocity: able to fluctuate   General Gait Details: pt with mild instability but was able to participate in higher level balance activities   Stairs Stairs: Yes Stairs assistance: Supervision Stair Management: No rails;Alternating pattern;Forwards Number of  Stairs: 2(x2 trials) General stair comments: pt with improved stability on stairs today   Wheelchair Mobility    Modified Rankin (Stroke Patients Only) Modified Rankin (Stroke Patients Only) Pre-Morbid Rankin Score: No symptoms Modified Rankin: Moderate disability     Balance Overall balance assessment: Needs assistance Sitting-balance support: No upper extremity supported Sitting balance-Leahy Scale: Good     Standing balance support: No upper extremity supported Standing balance-Leahy Scale: Good               High level balance activites: Backward walking;Direction changes;Turns High Level Balance Comments: supervision to min guard level for safety            Cognition Arousal/Alertness: Awake/alert Behavior During Therapy: WFL for tasks assessed/performed Overall Cognitive Status: Within Functional Limits for tasks assessed Area of Impairment: Memory;Problem solving                           Awareness: Emergent Problem Solving: Slow processing;Requires verbal cues;Requires tactile cues General Comments: Pt and wife report pt is demonstrating intermittent memory deficits - he will randomly ask what happened to him, or not recognize someone that he should.  MOCA administered.  He scored 23/30 with errors noted in language, memory (only able to recall 1/5 works even with cues).   he also had difficulty drawing a cube.  Reviewed current deficits with pt and wife.  discussed activities they could work on at home for memory deficits.        Exercises Other Exercises Other Exercises: tandem walking x20' with min guard for safety without UE supports Other Exercises: SLS on either LE without UE supports (  L LE for 8 seconds, R LE for 12 seconds)    General Comments General comments (skin integrity, edema, etc.): spoke with pt and wife re: stroke fatigue, need to take brain breaks, visual and cognitive activiites they could perform at home, and need for follow  up OT       Pertinent Vitals/Pain Pain Assessment: No/denies pain    Home Living                      Prior Function            PT Goals (current goals can now be found in the care plan section) Acute Rehab PT Goals PT Goal Formulation: With patient/family Time For Goal Achievement: 01/17/18 Potential to Achieve Goals: Good Progress towards PT goals: Progressing toward goals    Frequency    Min 4X/week      PT Plan Current plan remains appropriate    Co-evaluation              AM-PAC PT "6 Clicks" Daily Activity  Outcome Measure  Difficulty turning over in bed (including adjusting bedclothes, sheets and blankets)?: None Difficulty moving from lying on back to sitting on the side of the bed? : None Difficulty sitting down on and standing up from a chair with arms (e.g., wheelchair, bedside commode, etc,.)?: A Little Help needed moving to and from a bed to chair (including a wheelchair)?: None Help needed walking in hospital room?: None Help needed climbing 3-5 steps with a railing? : None 6 Click Score: 23    End of Session Equipment Utilized During Treatment: Gait belt Activity Tolerance: Patient tolerated treatment well Patient left: in bed;with call bell/phone within reach Nurse Communication: Mobility status PT Visit Diagnosis: Other abnormalities of gait and mobility (R26.89);Other symptoms and signs involving the nervous system (R29.898);Hemiplegia and hemiparesis Hemiplegia - Right/Left: Right Hemiplegia - dominant/non-dominant: Dominant Hemiplegia - caused by: Cerebral infarction     Time: 1610-9604 PT Time Calculation (min) (ACUTE ONLY): 19 min  Charges:  $Neuromuscular Re-education: 8-22 mins                     Deborah Chalk, PT, DPT  Acute Rehabilitation Services Pager [phone] Office [phone]     Alessandra Bevels Deneise Getty 01/05/2018, 2:53 PM

## 2018-01-05 NOTE — Anesthesia Preprocedure Evaluation (Addendum)
Anesthesia Evaluation  Patient identified by MRN, date of birth, ID band Patient awake    Reviewed: Allergy & Precautions, NPO status , Patient's Chart, lab work & pertinent test results  Airway Mallampati: II  TM Distance: >3 FB Neck ROM: Full    Dental no notable dental hx.    Pulmonary neg pulmonary ROS,    Pulmonary exam normal breath sounds clear to auscultation       Cardiovascular hypertension, Normal cardiovascular exam Rhythm:Regular Rate:Normal  ECG: NSR, rate 81  ECHO: LV EF: 55% -   60%   Neuro/Psych negative neurological ROS  negative psych ROS   GI/Hepatic negative GI ROS, Neg liver ROS,   Endo/Other  negative endocrine ROS  Renal/GU negative Renal ROS     Musculoskeletal negative musculoskeletal ROS (+)   Abdominal   Peds  Hematology HLD   Anesthesia Other Findings STROKE  Reproductive/Obstetrics                           Anesthesia Physical Anesthesia Plan  ASA: III  Anesthesia Plan: MAC   Post-op Pain Management:    Induction: Intravenous  PONV Risk Score and Plan: 1 and Propofol infusion and Treatment may vary due to age or medical condition  Airway Management Planned: Natural Airway  Additional Equipment:   Intra-op Plan:   Post-operative Plan:   Informed Consent: I have reviewed the patients History and Physical, chart, labs and discussed the procedure including the risks, benefits and alternatives for the proposed anesthesia with the patient or authorized representative who has indicated his/her understanding and acceptance.   Dental advisory given  Plan Discussed with: CRNA  Anesthesia Plan Comments:         Anesthesia Quick Evaluation

## 2018-01-05 NOTE — Progress Notes (Signed)
Occupational Therapy Treatment Patient Details Name: Gary Steele MRN: [id number] DOB: [birth year] Today's Date: 01/05/2018    History of present illness 37 y.o. male, who is visiting [place] from out of town for a work meeting and began having R sided weakness, numbness, difficulty with speech and blurred vision. Suspected stroke s/p IV tPA.  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  Pt with no other significant PMH.     OT comments  Pt subjectively reports vision is a bit improved.  He does seem to be able to read up to three words in phrase now.  Pt scored 23/30 on the MOCA.   Extensive discussion with pt and wife re: current deficits, home activities for vision and cognition, need for "brain breaks", recommendation for OPOT, and follow up with ophthalmologist.   He is eager to discharge home.  He is able to perform ADLs at supervision level.  Recommend no driving at this time, and no return to work until cleared by OPOT.   Follow Up Recommendations  Outpatient OT;Supervision - Intermittent    Equipment Recommendations  None recommended by OT    Recommendations for Other Services      Precautions / Restrictions Precautions Precautions: Fall       Mobility Bed Mobility Overal bed mobility: Modified Independent                Transfers     Transfers: Sit to/from Stand Sit to Stand: Supervision              Balance Overall balance assessment: Mild deficits observed, not formally tested Sitting-balance support: No upper extremity supported                                       ADL either performed or assessed with clinical judgement   ADL Overall ADL's : Needs assistance/impaired                                             Vision   Additional Comments: Pt reports he feels vision is a bit better than  yesterday. He is able to read up to 3 word phrase today.  Discussed current visual deficits with pt and wife and impact on function.  Instructed them to check in with his ophthalmologist once they return home.  Discussed activities to improve visual scanning    Perception     Praxis      Cognition Arousal/Alertness: Awake/alert Behavior During Therapy: WFL for tasks assessed/performed Overall Cognitive Status: Impaired/Different from baseline Area of Impairment: Memory;Problem solving                           Awareness: Emergent Problem Solving: Slow processing;Requires verbal cues;Requires tactile cues General Comments: Pt and wife report pt is demonstrating intermittent memory deficits - he will randomly ask what happened to him, or not recognize someone that he should.  MOCA administered.  He scored 23/30 with errors noted in language, memory (only able to recall 1/5 works even with cues).   he also had difficulty drawing a cube.  Reviewed current deficits with pt and wife.  discussed activities they could work  on at home for memory deficits.          Exercises     Shoulder Instructions       General Comments spoke with pt and wife re: stroke fatigue, need to take brain breaks, visual and cognitive activiites they could perform at home, and need for follow up OT     Pertinent Vitals/ Pain       Pain Assessment: No/denies pain  Home Living                                          Prior Functioning/Environment              Frequency  Min 3X/week        Progress Toward Goals  OT Goals(current goals can now be found in the care plan section)  Progress towards OT goals: Progressing toward goals     Plan Discharge plan remains appropriate;Equipment recommendations need to be updated    Co-evaluation                 AM-PAC PT "6 Clicks" Daily Activity     Outcome Measure   Help from another person eating meals?: None Help  from another person taking care of personal grooming?: None Help from another person toileting, which includes using toliet, bedpan, or urinal?: None Help from another person bathing (including washing, rinsing, drying)?: None Help from another person to put on and taking off regular upper body clothing?: None Help from another person to put on and taking off regular lower body clothing?: None 6 Click Score: 24    End of Session    OT Visit Diagnosis: Unsteadiness on feet (R26.81);Low vision, both eyes (H54.2);Pain Pain - Right/Left: Right Pain - part of body: Leg   Activity Tolerance Patient tolerated treatment well   Patient Left in bed;with call bell/phone within reach   Nurse Communication          Time: 1011-1105 OT Time Calculation (min): 54 min  Charges: OT General Charges $OT Visit: 1 Visit OT Treatments $Therapeutic Activity: 53-67 mins  Jeani Hawking, OTR/L Acute Rehabilitation Services Pager [phone] Office [phone]    Jeani Hawking M 01/05/2018, 2:04 PM

## 2018-01-05 NOTE — Anesthesia Postprocedure Evaluation (Signed)
Anesthesia Post Note  Patient: Gary Steele  Procedure(s) Performed: TRANSESOPHAGEAL ECHOCARDIOGRAM (TEE) (N/A ) BUBBLE STUDY     Patient location during evaluation: PACU Anesthesia Type: MAC Level of consciousness: awake and alert Pain management: pain level controlled Vital Signs Assessment: post-procedure vital signs reviewed and stable Respiratory status: spontaneous breathing, nonlabored ventilation, respiratory function stable and patient connected to nasal cannula oxygen Cardiovascular status: stable and blood pressure returned to baseline Postop Assessment: no apparent nausea or vomiting Anesthetic complications: no    Last Vitals:  Vitals:   01/05/18 1425 01/05/18 1430  BP: 132/90 138/85  Pulse: 92 88  Resp: 17 15  Temp:    SpO2: 96% 96%    Last Pain:  Vitals:   01/05/18 1425  TempSrc:   PainSc: 0-No pain                 Ryan P Ellender

## 2018-01-05 NOTE — Transfer of Care (Signed)
Immediate Anesthesia Transfer of Care Note  Patient: Gary Steele  Procedure(s) Performed: TRANSESOPHAGEAL ECHOCARDIOGRAM (TEE) (N/A ) BUBBLE STUDY  Patient Location: Endoscopy Unit  Anesthesia Type:MAC  Level of Consciousness: awake  Airway & Oxygen Therapy: Patient Spontanous Breathing and Patient connected to nasal cannula oxygen  Post-op Assessment: Report given to RN and Post -op Vital signs reviewed and stable  Post vital signs: Reviewed and stable  Last Vitals:  Vitals Value Taken Time  BP 114/91 01/05/2018  2:14 PM  Temp 37.3 C 01/05/2018  2:13 PM  Pulse 107 01/05/2018  2:14 PM  Resp 21 01/05/2018  2:14 PM  SpO2 97 % 01/05/2018  2:14 PM  Vitals shown include unvalidated device data.  Last Pain:  Vitals:   01/05/18 1413  TempSrc: Oral  PainSc: 0-No pain      Patients Stated Pain Goal: 2 (91/66/06 0045)  Complications: No apparent anesthesia complications

## 2018-01-05 NOTE — CV Procedure (Signed)
INDICATIONS: stroke  PROCEDURE:   Informed consent was obtained prior to the procedure. The risks, benefits and alternatives for the procedure were discussed and the patient comprehended these risks.  Risks include, but are not limited to, cough, sore throat, vomiting, nausea, somnolence, esophageal and stomach trauma or perforation, bleeding, low blood pressure, aspiration, pneumonia, infection, trauma to the teeth and death.    After a procedural time-out, anesthesia administered sedation.  The patient's heart rate, blood pressure, and oxygen saturationweare monitored continuously during the procedure. The period of conscious sedation was 30 minutes, of which I was present face-to-face 100% of this time.  The transesophageal probe was inserted in the esophagus and stomach without difficulty and multiple views were obtained.  The patient was kept under observation until the patient left the procedure room.  The patient left the procedure room in stable condition.   Agitated microbubble saline contrast was administered.  COMPLICATIONS:    There were no immediate complications.  FINDINGS:  Study Conclusions  - Left ventricle: The cavity size was normal. Wall thickness was   normal. Systolic function was normal. The estimated ejection   fraction was in the range of 60% to 65%. Wall motion was normal;   there were no regional wall motion abnormalities. - Aortic valve: Structurally normal valve. Trileaflet; normal   thickness leaflets. No evidence of vegetation. There was no   regurgitation. - Aorta: The aorta was normal, not dilated, and non-diseased. There   was no atheroma. - Mitral valve: No evidence of vegetation. There was trivial   regurgitation. - Left atrium: No evidence of thrombus in the atrial cavity or   appendage. No masses are seen adherent to the atrial septum to   correlate with the finding on TTE. The appendage was   morphologically a left appendage, multilobulated,  and small.   Emptying velocity was normal. - Right ventricle: The cavity size was normal. Wall thickness was   normal. Systolic function was normal. - Right atrium: No evidence of thrombus in the atrial cavity or   appendage. - Atrial septum: There was increased thickness of the septum,   consistent with lipomatous hypertrophy. No defect or patent   foramen ovale was identified. - Tricuspid valve: No evidence of vegetation. There was trivial   regurgitation. - Pulmonic valve: There was trivial regurgitation. - Pericardium, extracardiac: A trivial pericardial effusion was   identified.  Impressions:  - No PFO, normal aorta without atheroma, no LV, LA or LA appendage   thrombus. No valvular vegitations, lesions or thrombi.     The echodensity seen on TTE may correspond to artifact created by   lipomatous hypertrophy of the atrial septum. No left atrial or   atrial septal masses, lesions or thrombi to otherwise explain   this finding. Side by side comparison of images performed.  Time Spent Directly with the Patient:  60 minutes   Parke Poisson 01/05/2018, 5:54 PM

## 2018-01-05 NOTE — Progress Notes (Signed)
  Echocardiogram Echocardiogram Transesophageal has been performed.  Leta Jungling M 01/05/2018, 2:26 PM

## 2018-01-05 NOTE — Care Management Note (Signed)
Case Management Note  Patient Details  Name: Gary Steele MRN: [id number] Date of Birth: [birth year]  Subjective/Objective:     Pt admitted with a stroke. He is from home with his spouse. Pt IADL prior to admission. Pt has no DME and no transportation issues. PCP: [facility] in [place]               Action/Plan: Recommendations are for outpatient therapy. CM met with the patient and his wife and they would like to attend at [facility] outpatient therapy. CM called the outpatient rehab and faxed [phone]) them the needed information. They will contact him for the first appointment. Information on the AVS. Pt on Brilinta. CM provided his wife the discount card.  Pts wife to provide transportation home when he is ready for d/c.  Expected Discharge Date:                  Expected Discharge Plan:  OP Rehab  In-House Referral:     Discharge planning Services  CM Consult  Post Acute Care Choice:    Choice offered to:     DME Arranged:    DME Agency:     HH Arranged:    [place] Agency:     Status of Service:  Completed, signed off  If discussed at [employer] of Stay Meetings, dates discussed:    Additional Comments:  Pollie Friar, RN 01/05/2018, 12:36 PM

## 2018-01-06 ENCOUNTER — Encounter (HOSPITAL_COMMUNITY): Payer: Self-pay | Admitting: Internal Medicine

## 2018-01-15 ENCOUNTER — Other Ambulatory Visit: Payer: Self-pay | Admitting: *Deleted

## 2018-01-15 NOTE — Patient Outreach (Addendum)
[facility]) Care Management  01/15/2018  Gary Steele 1981-03-07 [id number]   EMMI-stroke EMMI-general discharge, COPD, Heart failure   RED ON EMMI ALERT Day # 6 Date: 01/14/18 1006 Red Alert Reason: feeling worse overall? No  Insurance [employer] cross and blue shield   Outreach attempt # 1 Patient is able to verify HIPAA [facility] Care Management RN reviewed and addressed red alert with patient Gary Steele states the red alert answer was incorrect He states he is feeling much better than when he was in the hospital and "still recovering" He denies any worsening conditions and thanks [facility] RN CM for calling to follow up with him  Social: Recent hospital d/c on 01/05/18 after visiting [place] from out of town for a work meeting and dx with a stroke He has support of his spouse and reports doing well with ADLs and iADLs. He denies issues with transportation to medical appointments and is being assisted by his spouse.   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, left frontal right occipital, right pontine, VA dissection, left s/p pipeline stent, HTN, HLD, left ventricular cardiac abnormality, swelling of left hand, etoh use    Medications: denies concerns with taking medications as prescribed, affording medications, side effects of medications and questions about medications Encourage to call [facility] if issues with brilinta cost He ws given a discount card during the hospitalization by the hospital CM     Appointments: Dr Corliss Skains, in 3 months, neuro in 4 weeks, pcp in 2 weeks was recommended  Dr Sharma Covert [address] [place] [address] [phone] Neurology will be seen  New Primary MD Nicolette Bang at [facility] [phone] to be seen on 01/18/18   He is to attend the outpatient rehab at [facility] for therapies Referral information was sent by hospital CM   Advance Directives: Denies need for  assist with advance directives He reports getting the forms the hospital     Consent: [facility] RN CM reviewed [facility] services with patient. Patient gave verbal consent for services. Advised patient that there will be further automated EMMI- post discharge calls to assess how the patient is doing following the recent hospitalization Advised the patient that another call may be received from a nurse if any of their responses were abnormal. Patient voiced understanding and was appreciative of f/u call. He denies need of services from [facility] Community/Telephonic RN CM, pharmacy, health coach, NP or SW at this time     Plan: [facility] RN CM will close case at this time as patient has been assessed and no needs identified.   [facility] RN CM sent a successful outreach letter as discussed with [facility] brochure enclosed for review Pt encouraged to return a call to [facility] RN CM prn   Jaslin Novitski L. Noelle Penner, RN, BSN, CCM [facility] Telephonic Care Management Care Coordinator Office number [phone] Mobile number [phone]  Main [facility] number [phone] Fax number [phone]

## 2018-01-26 ENCOUNTER — Other Ambulatory Visit: Payer: Self-pay | Admitting: *Deleted

## 2018-01-26 NOTE — Patient Outreach (Signed)
[facility]) Care Management  01/26/2018  Gary Steele 01-18-1981 [id number]   Care coordination   [facility] RN CM notified by [facility] CMA that the outreach letter returned [facility] RN CM called Mr Gary Steele  Patient is able to verify HIPAA Reviewed purpose of the call for update and correction of address with patient Consent: [facility] RN CM reviewed [facility] services with patient. Patient gave verbal consent for services. Mr Gary Steele informs CM he is steadily improving His home therapy is concluding soon and he returns to [facility] for MD f/u on 02/02/18  He reports still some fatigue with activity He reports going with his son and wife to a Halloween event on Wednesday 01/24/18 and feeling "wiped out half way through" Cm encouraged pacing his activities, rest and if this continues for him to remember to discuss it with the MD to see if labs need to be drawn to check his items like his hgb. He voiced understanding and appreciation for the call  Plan [facility] RN CM will resend Mr Gary Steele's [facility] Successful outreach letter Reminded him to call CM or 24 hr RN prn    Cala Bradford L. Noelle Penner, RN, BSN, CCM [facility] Telephonic Care Management Care Coordinator Office number [phone] Mobile number [phone]  Main [facility] number [phone] Fax number [phone]

## 2018-02-02 ENCOUNTER — Encounter: Payer: Self-pay | Admitting: Adult Health

## 2018-02-02 ENCOUNTER — Ambulatory Visit: Payer: BLUE CROSS/BLUE SHIELD | Admitting: Adult Health

## 2018-02-02 VITALS — BP 120/81 | HR 80 | Ht 74.0 in | Wt 274.2 lb

## 2018-02-02 DIAGNOSIS — I7774 Dissection of vertebral artery: Secondary | ICD-10-CM

## 2018-02-02 DIAGNOSIS — I10 Essential (primary) hypertension: Secondary | ICD-10-CM | POA: Diagnosis not present

## 2018-02-02 DIAGNOSIS — I63532 Cerebral infarction due to unspecified occlusion or stenosis of left posterior cerebral artery: Secondary | ICD-10-CM | POA: Diagnosis not present

## 2018-02-02 DIAGNOSIS — E785 Hyperlipidemia, unspecified: Secondary | ICD-10-CM | POA: Diagnosis not present

## 2018-02-02 NOTE — Patient Instructions (Signed)
Continue aspirin 81 mg daily and Brilinta  and atorvastatin 40mg   for secondary stroke prevention  Continue to follow up with PCP regarding cholesterol and blood pressure management   Speak to PCP regarding filling out disability forms as you should not return to work until you are seen by eye doctor along with clearance from occupation and speech therapy   Continue to follow up with Dr. Corliss Skains as scheduled  Continue speech therapy for cognitive concerns - evaluate for level of function regarding driving along with being able to return to work  Continue occupation therapy for visual loss - evaluate for compensation strategies regarding visual loss and possible return to driving along with reaction times and multi-tasking strategies   Schedule appointment with eye doctor for peripheral field testing to see if it would be safe to start driving   Continue to monitor blood pressure at home  Maintain strict control of hypertension with blood pressure goal below 130/90, diabetes with hemoglobin A1c goal below 6.5% and cholesterol with LDL cholesterol (bad cholesterol) goal below 70 mg/dL. I also advised the patient to eat a healthy diet with plenty of whole grains, cereals, fruits and vegetables, exercise regularly and maintain ideal body weight.  Followup in the future with me as needed or call earlier if needed       Thank you for coming to see [place] at [facility]. I hope we have been able to provide you high quality care today.  You may receive a patient satisfaction survey over the next few weeks. We would appreciate your feedback and comments so that we may continue to improve ourselves and the health of our patients.

## 2018-02-02 NOTE — Progress Notes (Signed)
[facility] [address] [place]. [place] [address] [phone]       OFFICE FOLLOW UP NOTE  Gary Steele Date of Birth:  [birth year] Medical Record Number:  [id number]   Reason for Referral:  hospital stroke follow up  CHIEF COMPLAINT:  Chief Complaint  Patient presents with  . Hospitalization Follow-up    Wife and son present. Rm 9. Patient mentioned that since he has been home he has been improving. Patient's wife mentioned that he still naps a little throughout the day.     HPI: Gary Steele is being seen today for initial visit in the office for left PCA territory infarcts s/p left PCA and pipeline stenting left VA with TICI 3 reperfusion with complication of new left frontal and right occipital infarcts on 01/02/2018. History obtained from patient, wife and chart review. Reviewed all radiology images and labs personally.  Gary Steele a 37 y.o.malewith history of HTN who was admitted for blurry vision, slurry speech, right hemianopia.  Per notes, patient visiting [place] from out of town for work meeting when suddenly started noticing blurry vision and then his colleagues noted that his speech was slurred therefore EMS was called.  CT had reviewed and was negative for acute abnormalities.  CTA head and neck with preliminary review negative for acute abnormalities.  Due to inconsistent exam with no certain findings on CT and CTA, he was taken for stat MRI of the brain which showed DWI changes in the left PCA territory.  Per notes, while patient was in the MRI, CTA was reviewed again by radiology report a left vertebral artery dissection and possible left PCA to P2 segment occlusion with widely patent cervical carotid arteries.  TPA was started at that time and taken to IR suite for endovascular thrombectomy.  Upon review, patient did awaken with some neck pain but denies any other type of neurological symptoms.  Patient did undergo left  PCA and pipeline stenting left VA with TICI 3 reperfusion.  Repeat MRI did show left PCA, left cerebellum and right pons infarct along with new left frontal and right occipital infarcts which were felt related to cerebral angiogram procedure.  2D echo showed EF of 55 to 60%.  TEE without significant findings.  LDL 71 and A1c 5.2.  Patient on aspirin 81 mg and Brilinta with recommendation of follow-up with vascular surgery outpatient.  Patient was discharged home in stable condition with recommendations of outpatient PT/OT with continued deficits of right upper quadrantanopia and right hemiparesthesia.  Patient is being seen today for hospital follow-up and is accompanied by his wife and son.  He does continue to endorse vision impairment but overall improving.  He does have memory concerns such as possibly taking longer to complete a task or difficulty remembering his friends names.  He has not returned to work at this time as he works as a Firefighter at [employer] and lives approximately 3.5 hours away.  He has not returned to driving at this time either as he is unsure if his reaction time or multitasking ability has been impaired.  He is currently receiving PT/OT but denies any type of learning compensation strategies due to visual loss or being evaluated for multitasking level or reaction times in order to return to driving for the work environment.  He has not been evaluated by ophthalmology at this time as it was recommended for him to wait at least until 3 months as his eyes can continue  to improve at this time.  Patient states his frustrations with his perceived lack of improvement and frustrations regarding being unable to return to work due to his continued visual loss, cognitive difficulties and increased fatigue with activity.  He does endorse possible depression due to current situation but would like to think about possibly starting antidepressant medication.  He will be following with Dr. Corliss Skains in  January for repeat imaging.  No further concerns at this time.  Denies new or worsening stroke/TIA symptoms.   ROS:   14 system review of systems performed and negative with exception of fatigue, blurred vision, memory loss, depression and sleepiness  PMH:  Past Medical History:  Diagnosis Date  . Hypertension     PSH:  Past Surgical History:  Procedure Laterality Date  . IR ANGIO INTRA EXTRACRAN SEL COM CAROTID INNOMINATE BILAT MOD SED  01/02/2018  . IR ANGIO VERTEBRAL SEL VERTEBRAL UNI R MOD SED  01/02/2018  . IR CT HEAD LTD  01/02/2018  . IR INTRA CRAN STENT  01/02/2018  . IR PERCUTANEOUS ART THROMBECTOMY/INFUSION INTRACRANIAL INC DIAG ANGIO  01/02/2018  . RADIOLOGY WITH ANESTHESIA N/A 01/02/2018   Procedure: IR WITH ANESTHESIA;  Surgeon: Julieanne Cotton, MD;  Location: [facility] OR;  Service: Radiology;  Laterality: N/A;  . TEE WITHOUT CARDIOVERSION N/A 01/05/2018   Procedure: TRANSESOPHAGEAL ECHOCARDIOGRAM (TEE);  Surgeon: Parke Poisson, MD;  Location[facility] ENDOSCOPY;  Service: Cardiology;  Laterality: N/A;    Social History:  Social History   Socioeconomic History  . Marital status: Married    Spouse name: Not on file  . Number of children: Not on file  . Years of education: Not on file  . Highest education level: Not on file  Occupational History  . Not on file  Social Needs  . Financial resource strain: Not on file  . Food insecurity:    Worry: Not on file    Inability: Not on file  . Transportation needs:    Medical: Not on file    Non-medical: Not on file  Tobacco Use  . Smoking status: Unknown If Ever Smoked  . Smokeless tobacco: Never Used  Substance and Sexual Activity  . Alcohol use: Not Currently  . Drug use: Never  . Sexual activity: Yes    Partners: Female  Lifestyle  . Physical activity:    Days per week: Not on file    Minutes per session: Not on file  . Stress: To some extent  Relationships  . Social connections:    Talks on phone: Not on file      Gets together: Not on file    Attends religious service: Not on file    Active member of club or organization: Not on file    Attends meetings of clubs or organizations: Not on file    Relationship status: Not on file  . Intimate partner violence:    Fear of current or ex partner: No    Emotionally abused: No    Physically abused: No    Forced sexual activity: No  Other Topics Concern  . Not on file  Social History Narrative  . Not on file    Family History: History reviewed. No pertinent family history.  Medications:   Current Outpatient Medications on File Prior to Visit  Medication Sig Dispense Refill  . aspirin 81 MG chewable tablet Chew 1 tablet (81 mg total) by mouth daily.    Marland Kitchen atorvastatin (LIPITOR) 40 MG tablet Take 1 tablet (40  mg total) by mouth daily at 6 PM. 30 tablet 2  . fluticasone (FLONASE) 50 MCG/ACT nasal spray Place 2 sprays into both nostrils as needed.     . ticagrelor (BRILINTA) 90 MG TABS tablet Take 1 tablet (90 mg total) by mouth 2 (two) times daily. 60 tablet 2   No current facility-administered medications on file prior to visit.     Allergies:  No Known Allergies   Physical Exam  Vitals:   02/02/18 1046  BP: 120/81  Pulse: 80  Weight: 274 lb 3.2 oz (124.4 kg)  Height: 6\' 2"  (1.88 m)   Body mass index is 35.21 kg/m. No exam data present  General: well developed, well nourished, pleasant middle-aged Caucasian male, seated, in no evident distress Head: head normocephalic and atraumatic.   Neck: supple with no carotid or supraclavicular bruits Cardiovascular: regular rate and rhythm, no murmurs Musculoskeletal: no deformity Skin:  no rash/petichiae Vascular:  Normal pulses all extremities  Neurologic Exam Mental Status: Awake and fully alert. Oriented to place and time. Recent and remote memory intact. Attention span, concentration and fund of knowledge appropriate. Mood and affect appropriate.  Cranial Nerves: Fundoscopic exam  reveals sharp disc margins. Pupils equal, briskly reactive to light. Extraocular movements full without nystagmus. Visual fields right homonymous superior quadrantanopia. Hearing intact. Facial sensation intact. Face, tongue, palate moves normally and symmetrically.  Motor: Normal bulk and tone. Normal strength in all tested extremity muscles. Sensory.: intact to touch , pinprick , position and vibratory sensation.  Coordination: Rapid alternating movements normal in all extremities. Finger-to-nose and heel-to-shin performed accurately bilaterally. Gait and Station: Arises from chair without difficulty. Stance is normal. Gait demonstrates normal stride length and balance . Able to heel, toe and tandem walk without difficulty.  Reflexes: 1+ and symmetric. Toes downgoing.    NIHSS  1 Modified Rankin  2   Diagnostic Data (Labs, Imaging, Testing)  CT HEAD WO CONTRAST 01/02/2018 IMPRESSION: 1. Unremarkable CT appearance of the brain.  CT ANGIO HEAD W OR WO CONTRAST CT ANGIO NECK W OR WO CONTRAST CT CEREBRAL PERFUSION W CONTRAST 01/02/2018 IMPRESSION: 1. Abnormal appearance of the left V3 segment concerning for dissection. 2. Left P2 branch occlusion. 3. Widely patent cervical carotid arteries. 4. Poor quality perfusion imaging with extensive image noise.  MR BRAIN WO CONTRAST MR MRA HEAD WO CONTRAST 01/03/2018 IMPRESSION: Primarily posterior circulation ischemia, with predominant involvement of the LEFT PCA, but additional involvement of LEFT cerebellum and RIGHT pons, similar to the prior MR 11/04/2017.  Additional punctate areas of acute ischemia, for instance LEFT frontal, RIGHT occipital, were not present previously, but doubt clinically significant.  Premature for age white matter changes, uncertain etiology.  No reperfusion hemorrhage is evident with particular attention to the LEFT PCA territory.  Wide patency of the endovascular treated areas on intracranial  MRA, specifically LEFT vertebral and LEFT PCA.  Cerebral Angiogram S/P Bilateral vert, bilateral common carotid arteriogram followed by complete revascularization of occluded Lt PCA P1 with 1 pass embotrap 5 mmx 33 mm retriever device achieving a TICI 3 revascularization and reconstructing acutely dissected Lt VA at V2 ,V3 and V4 segs with string sign stenosis, with x 3 pipeline flow diverter devices.   TTE - Left ventricle: The cavity size was normal. There was mildconcentric hypertrophy. Systolic function was normal. Theestimated ejection fraction was in the range of 55% to 60%. Wallmotion was normal; there were no regional wall motionabnormalities. Left ventricular diastolic function parameterswere normal. - Left atrium: ? density  in LA vs. artifact. The atrium was mildlydilated. - Pulmonary arteries: Systolic pressure could not be accuratelyestimated. Impressions: ? density in LA vs. artifact. Recommend TEE for furtherevaluation given CVA.  TEE No significant findings  LUE doppler There is no obvious evidence of DVT or SVT noted in the left upper extremity.      ASSESSMENT: Gary Steele is a 37 y.o. year old male here with left PCA, left PICA and right pontine small infarcts likely due to left VA dissection a left P2 occlusion S/P tPA and TICI 3 of left PCA pipeline stenting a left VA with new finding of left frontal and right occipital infarcts on repeat MRI likely related to cerebral anterior procedure on 01/02/2018. Vascular risk factors include HTN.     PLAN: -Continue aspirin 81 mg daily and Brilinta  and atorvastatin 40 mg for secondary stroke prevention -F/u with PCP regarding your HLD and HTN management -Recommended to continue occupational therapy along with ensuring that he is being assessed for visual loss and compensation strategies along with assessment on function regarding driving -Recommended to continue with speech therapy due to ongoing  cognitive concerns -Advised to schedule appointment with ophthalmology for peripheral field testing for driving clearance -Advised to not drive or return to work at this time due to continued visual loss and cognitive concerns until he is cleared by ophthalmology, OT and speech -Patient declining depression management at this time but did highly encouraged to consider this and follow-up with PCP -Advised patient to follow-up with PCP in regards to further disability management as he will be following up in this office as needed due to distance -Long discussion with patient and wife regarding timeframe of stroke recovery along with reviewing images of MRI -F/U with Dr. Corliss Skains as scheduled in 03/2018 for repeat imaging -continue to monitor BP at home -advised to continue to stay active and maintain a healthy diet -Maintain strict control of hypertension with blood pressure goal below 130/90, diabetes with hemoglobin A1c goal below 6.5% and cholesterol with LDL cholesterol (bad cholesterol) goal below 70 mg/dL. I also advised the patient to eat a healthy diet with plenty of whole grains, cereals, fruits and vegetables, exercise regularly and maintain ideal body weight.  Follow up as needed as patient lives 3[place]5 hours away and they do have appointment scheduled with neurologist in her area in 03/2018.  Did advise patient and wife to call our office with any questions, concerns or need of future follow-up appointment    Greater than 50% of time during this 25 minute visit was spent on counseling,explanation of diagnosis of multiple PICA territory infarct, reviewing risk factor management of dissection, PCA and VA stenting, HTN and HLD, planning of further management, discussion with patient and family and coordination of care    George Hugh, AGNP-BC  [facility] [address] [place], [place] [address]  Phone [phone] Fax [phone] Note: This  document was prepared with digital dictation and possible smart phrase technology. Any transcriptional errors that result from this process are unintentional.

## 2018-02-10 NOTE — Progress Notes (Signed)
I agree with the above plan 

## 2018-03-29 ENCOUNTER — Other Ambulatory Visit: Payer: Self-pay

## 2018-03-29 NOTE — Patient Outreach (Signed)
First attempt to obtain mRs. No answer. Left message for return call.  

## 2018-04-02 ENCOUNTER — Other Ambulatory Visit: Payer: Self-pay

## 2018-04-02 NOTE — Patient Outreach (Signed)
Telephone outreach to patient to obtain mRs was successfully completed. mRs= 2. 

## 2018-04-25 ENCOUNTER — Other Ambulatory Visit (HOSPITAL_COMMUNITY): Payer: Self-pay | Admitting: Interventional Radiology

## 2018-04-25 DIAGNOSIS — I639 Cerebral infarction, unspecified: Secondary | ICD-10-CM

## 2018-05-16 ENCOUNTER — Ambulatory Visit (HOSPITAL_COMMUNITY)
Admission: RE | Admit: 2018-05-16 | Discharge: 2018-05-16 | Disposition: A | Discharge status: Home / Self Care | Payer: BLUE CROSS/BLUE SHIELD | Source: Ambulatory Visit | Attending: Interventional Radiology | Admitting: Interventional Radiology

## 2018-05-16 ENCOUNTER — Other Ambulatory Visit (HOSPITAL_COMMUNITY): Payer: Self-pay | Admitting: Interventional Radiology

## 2018-05-16 ENCOUNTER — Ambulatory Visit (HOSPITAL_COMMUNITY): Payer: BLUE CROSS/BLUE SHIELD

## 2018-05-16 DIAGNOSIS — I639 Cerebral infarction, unspecified: Secondary | ICD-10-CM

## 2018-05-16 MED ORDER — IOPAMIDOL (ISOVUE-370) INJECTION 76%
75.0000 mL | Freq: Once | INTRAVENOUS | Status: AC | PRN
  Administered 2018-05-16: 75 mL via INTRAVENOUS

## 2018-05-16 MED ORDER — IOPAMIDOL (ISOVUE-370) INJECTION 76%
INTRAVENOUS | Status: AC
  Filled 2018-05-16: qty 100

## 2018-05-16 NOTE — Progress Notes (Signed)
Chief Complaint: Patient was seen in consultation today for left PCA P1 segment occlusion s/p revascularization AND left VA V2, V3, and V4 segments acute dissection s/p reconstruction.  Referring Physician(s): CODE STROKE- Milon Dikes  Supervising Physician: Julieanne Cotton  Patient Status: [facility] - Out-pt  History of Present Illness: Gary Steele is a 38 y.o. 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 He is known to [facility] and has been followed by Dr. Corliss Skains since 12/2017. He first presented to our department as an active code stroke 01/02/2018 with symptoms of right-sided weakness and slurred speech. He underwent an emergent image-guided cerebral arteriogram with mechanical thrombectomy of left PCA P1 segment occlusion achieving a TICI 3 revascularization along with reconstruction of left vertebral artery (V2, V3, and V4 segment) acute dissection using #3 pipeline flow diverters 01/02/2018 by Dr. Corliss Skains. He was discharged home 01/05/2018 in stable condition with 3 month imaging follow-up.  CTA head/neck 05/16/2018: 1. Unremarkable CTA head and neck. No significant stenosis, dissection, or occlusion. 2. Widely patent telescoping pipeline diverter stents across the LEFT vertebral dissection. 3. Chronic posterior circulation infarctions as described. No interval/new areas of cerebral ischemia are observed since the previous insult.  Patient presents today for follow-up regarding his recent stroke and procedure 01/02/2018 and to review recent CTA head/neck 05/16/2018. Patient awake and alert sitting in chair. Accompanied by wife. Complains of memory issues. States that he has the most difficulty with names. Complains of generalized fatigue. States that he was told that he has "low T" and this might contribute to his fatigue. Complains of left neck  soreness. Complains of RLE weakness, improved since discharge. Complains of right finger tip and RLE numbness/tingling, improved since discharge. Denies headache, dizziness, vision changes, hearing changes, tinnitus, or speech difficulty.  Patient is currently taking Brilinta 90 mg twice daily and Aspirin 81 mg once daily.   Past Medical History:  Diagnosis Date  . Hypertension     Past Surgical History:  Procedure Laterality Date  . IR ANGIO INTRA EXTRACRAN SEL COM CAROTID INNOMINATE BILAT MOD SED  01/02/2018  . IR ANGIO VERTEBRAL SEL VERTEBRAL UNI R MOD SED  01/02/2018  . IR CT HEAD LTD  01/02/2018  . IR INTRA CRAN STENT  01/02/2018  . IR PERCUTANEOUS ART THROMBECTOMY/INFUSION INTRACRANIAL INC DIAG ANGIO  01/02/2018  . RADIOLOGY WITH ANESTHESIA N/A 01/02/2018   Procedure: IR WITH ANESTHESIA;  Surgeon: Julieanne Cotton, MD;  Location: [facility] OR;  Service: Radiology;  Laterality: N/A;  . TEE WITHOUT CARDIOVERSION N/A 01/05/2018   Procedure: TRANSESOPHAGEAL ECHOCARDIOGRAM (TEE);  Surgeon: Parke Poisson, MD;  Location: [facility] ENDOSCOPY;  Service: Cardiology;  Laterality: N/A;    Allergies: Patient has no known allergies.  Medications: Prior to Admission medications   Medication Sig Start Date End Date Taking? Authorizing Provider  aspirin 81 MG chewable tablet Chew 1 tablet (81 mg total) by mouth daily. 01/06/18   Layne Benton, NP  atorvastatin (LIPITOR) 40 MG tablet Take 1 tablet (40 mg total) by mouth daily at 6 PM. 01/05/18   Layne Benton, NP  fluticasone (FLONASE) 50 MCG/ACT nasal spray Place 2 sprays into both nostrils as needed.     [provider]  ticagrelor (BRILINTA) 90 MG TABS tablet Take 1 tablet (90 mg total) by mouth 2 (two) times daily. 01/05/18   Layne Benton, NP     No family history on  file.  Social History   Socioeconomic History  . Marital status: Married    Spouse name: Not on file  . Number of children: Not on file  . Years of education: Not  on file  . Highest education level: Not on file  Occupational History  . Not on file  Social Needs  . Financial resource strain: Not on file  . Food insecurity:    Worry: Not on file    Inability: Not on file  . Transportation needs:    Medical: Not on file    Non-medical: Not on file  Tobacco Use  . Smoking status: Unknown If Ever Smoked  . Smokeless tobacco: Never Used  Substance and Sexual Activity  . Alcohol use: Not Currently  . Drug use: Never  . Sexual activity: Yes    Partners: Female  Lifestyle  . Physical activity:    Days per week: Not on file    Minutes per session: Not on file  . Stress: To some extent  Relationships  . Social connections:    Talks on phone: Not on file    Gets together: Not on file    Attends religious service: Not on file    Active member of club or organization: Not on file    Attends meetings of clubs or organizations: Not on file    Relationship status: Not on file  Other Topics Concern  . Not on file  Social History Narrative  . Not on file     Review of Systems: A 12 point ROS discussed and pertinent positives are indicated in the HPI above.  All other systems are negative.  Review of Systems  Constitutional: Negative for chills and fever.  HENT: Negative for hearing loss and tinnitus.   Eyes: Negative for visual disturbance.  Respiratory: Negative for shortness of breath and wheezing.   Cardiovascular: Negative for chest pain and palpitations.  Neurological: Positive for weakness and numbness. Negative for dizziness, speech difficulty and headaches.  Psychiatric/Behavioral: Negative for behavioral problems and confusion.    Physical Exam Constitutional:      General: He is not in acute distress.    Appearance: Normal appearance.  Pulmonary:     Effort: Pulmonary effort is normal. No respiratory distress.  Skin:    General: Skin is warm and dry.  Neurological:     Mental Status: He is alert and oriented to person,  place, and time.  Psychiatric:        Mood and Affect: Mood normal.        Behavior: Behavior normal.        Thought Content: Thought content normal.        Judgment: Judgment normal.      Imaging: Ct Angio Head W Or Wo Contrast  Result Date: 05/16/2018 CLINICAL DATA:  Status post revascularization of the occluded LEFT PCA P1 segment, TICI3 revascularization. Reconstruction of the LEFT vertebral artery in its V2, V3, and V4 segments with a pipeline flow diverter. History of hypertension. Remote LEFT occipital, LEFT posterior temporal, and LEFT greater than RIGHT cerebellar infarcts. EXAM: CT ANGIOGRAPHY HEAD AND NECK TECHNIQUE: Multidetector CT imaging of the head and neck was performed using the standard protocol during bolus administration of intravenous contrast. Multiplanar CT image reconstructions and MIPs were obtained to evaluate the vascular anatomy. Carotid stenosis measurements (when applicable) are obtained utilizing NASCET criteria, using the distal internal carotid diameter as the denominator. CONTRAST:  75mL ISOVUE-370 IOPAMIDOL (ISOVUE-370) INJECTION 76% COMPARISON:  MR brain  and MRA 01/03/2018. Catheter angiogram 01/02/2018. prior CTA 01/02/2018. FINDINGS: CT HEAD FINDINGS Brain: No acute stroke, acute hemorrhage, mass lesion, hydrocephalus, or extra-axial fluid. Normal for age cerebral volume. Chronic LEFT PCA temporal occipital and LEFT AICA cerebellar infarcts. No new areas of infarction are seen. Vascular: Reported separately Skull: Intact Sinuses: Retention cysts maxillary sinuses. Orbits: No acute findings. Review of the MIP images confirms the above findings CTA NECK FINDINGS Aortic arch: Standard branching. Imaged portion shows no evidence of aneurysm or dissection. No significant stenosis of the major arch vessel origins. Right carotid system: No evidence of dissection, stenosis (50% or greater) or occlusion. Left carotid system: No evidence of dissection, stenosis (50% or  greater) or occlusion. Vertebral arteries: Both vertebral arteries are patent. Widely patent telescoping pipeline diverter stents across the LEFT vertebral dissection, involving the distal V2 and entire V3 segments. Skeleton: Slight reversal normal cervical lordotic curve. No worrisome osseous lesions. Possible spinal stenosis C4-C5 due to central protrusion. Other neck: Noncontributory. Upper chest: No mass or pneumothorax. Review of the MIP images confirms the above findings CTA HEAD FINDINGS Anterior circulation: No significant stenosis, proximal occlusion, aneurysm, or vascular malformation. Posterior circulation: No significant stenosis, proximal occlusion, aneurysm, or vascular malformation.Specific attention directed to the LEFT PCA, and LEFT AICA, widely patent. Venous sinuses: As permitted by contrast timing, patent. Anatomic variants: None of significance Delayed phase: No abnormal postcontrast enhancement. Review of the MIP images confirms the above findings IMPRESSION: 1. Unremarkable CTA head and neck. No significant stenosis, dissection, or occlusion. 2. Widely patent telescoping pipeline diverter stents across the LEFT vertebral dissection. 3. Chronic posterior circulation infarctions as described. No interval/new areas of cerebral ischemia are observed since the previous insult. Electronically Signed   By: Elsie Stain M.D.   On: 05/16/2018 12:59   Ct Angio Neck W Or Wo Contrast  Result Date: 05/16/2018 CLINICAL DATA:  Status post revascularization of the occluded LEFT PCA P1 segment, TICI3 revascularization. Reconstruction of the LEFT vertebral artery in its V2, V3, and V4 segments with a pipeline flow diverter. History of hypertension. Remote LEFT occipital, LEFT posterior temporal, and LEFT greater than RIGHT cerebellar infarcts. EXAM: CT ANGIOGRAPHY HEAD AND NECK TECHNIQUE: Multidetector CT imaging of the head and neck was performed using the standard protocol during bolus administration of  intravenous contrast. Multiplanar CT image reconstructions and MIPs were obtained to evaluate the vascular anatomy. Carotid stenosis measurements (when applicable) are obtained utilizing NASCET criteria, using the distal internal carotid diameter as the denominator. CONTRAST:  75mL ISOVUE-370 IOPAMIDOL (ISOVUE-370) INJECTION 76% COMPARISON:  MR brain and MRA 01/03/2018. Catheter angiogram 01/02/2018. prior CTA 01/02/2018. FINDINGS: CT HEAD FINDINGS Brain: No acute stroke, acute hemorrhage, mass lesion, hydrocephalus, or extra-axial fluid. Normal for age cerebral volume. Chronic LEFT PCA temporal occipital and LEFT AICA cerebellar infarcts. No new areas of infarction are seen. Vascular: Reported separately Skull: Intact Sinuses: Retention cysts maxillary sinuses. Orbits: No acute findings. Review of the MIP images confirms the above findings CTA NECK FINDINGS Aortic arch: Standard branching. Imaged portion shows no evidence of aneurysm or dissection. No significant stenosis of the major arch vessel origins. Right carotid system: No evidence of dissection, stenosis (50% or greater) or occlusion. Left carotid system: No evidence of dissection, stenosis (50% or greater) or occlusion. Vertebral arteries: Both vertebral arteries are patent. Widely patent telescoping pipeline diverter stents across the LEFT vertebral dissection, involving the distal V2 and entire V3 segments. Skeleton: Slight reversal normal cervical lordotic curve. No worrisome osseous lesions. Possible  spinal stenosis C4-C5 due to central protrusion. Other neck: Noncontributory. Upper chest: No mass or pneumothorax. Review of the MIP images confirms the above findings CTA HEAD FINDINGS Anterior circulation: No significant stenosis, proximal occlusion, aneurysm, or vascular malformation. Posterior circulation: No significant stenosis, proximal occlusion, aneurysm, or vascular malformation.Specific attention directed to the LEFT PCA, and LEFT AICA, widely  patent. Venous sinuses: As permitted by contrast timing, patent. Anatomic variants: None of significance Delayed phase: No abnormal postcontrast enhancement. Review of the MIP images confirms the above findings IMPRESSION: 1. Unremarkable CTA head and neck. No significant stenosis, dissection, or occlusion. 2. Widely patent telescoping pipeline diverter stents across the LEFT vertebral dissection. 3. Chronic posterior circulation infarctions as described. No interval/new areas of cerebral ischemia are observed since the previous insult. Electronically Signed   By: Elsie StainJohn T Curnes M.D.   On: 05/16/2018 12:59    Labs:  CBC: Recent Labs    01/02/18 1350 01/02/18 1356 01/03/18 0500  WBC 8.7  --  9.5  HGB 15.6 15.6 13.1  HCT 46.1 46.0 39.5  PLT 242  --  345    COAGS: Recent Labs    01/02/18 1350  INR 1.04  APTT 25    BMP: Recent Labs    01/02/18 1350 01/02/18 1356 01/03/18 0500 01/04/18 0406  NA 140 140 135 140  K 3.1* 3.2* 4.9 3.6  CL 104 105 105 109  CO2 22  --  20* 23  GLUCOSE 120* 122* 110* 106*  BUN 12 14 7 7   CALCIUM 9.1  --  8.2* 8.7*  CREATININE 0.85 0.80 0.43* 0.79  GFRNONAA >60  --  >60 >60  GFRAA >60  --  >60 >60    LIVER FUNCTION TESTS: Recent Labs    01/02/18 1350  BILITOT 1.0  AST 27  ALT 34  ALKPHOS 58  PROT 7.2  ALBUMIN 4.2    Assessment and Plan:  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 Corliss Skainseveshwar. Left vertebral artery V2, V3, and V4 segment acute dissection s/p reconstruction using #3 pipeline flow diverter devices 01/02/2018 with Dr. Corliss Skainseveshwar. Dr. Corliss Skainseveshwar was present for consultation. Reviewed imaging with patient and wife. First brought to his attention was his prior stroke in his temporal lobe. Explained this is the probable cause of his memory issues. Next brought to his attention was his left vertebral artery dissection both prior to and following  intervention. Explained this is stable on most recent imaging. Explained the best course of management for his stents at this time is routine imaging scans to monitor for changes.  Discussed patient's neurology follow-up. Patient states that he [place]. Instructed patient to follow-up with his neurologist regularly.  Plan for follow-up with CTA head/neck (with contrast) in 6 months. Of note, as patient is driving 3 hours to this appointment, so plan for imaging scan and follow-up consult on same day. Informed patient that our schedulers will call him to set up this imaging scan. Instructed patient to continue taking Brilinta 90 mg twice daily and Aspirin 81 mg once daily. Explained we will re-visit continuation of DAPT following results of next imaging study.  All questions answered and concerns addressed. Patient and wife convey understanding and agree with plan.  Thank you for this interesting consult.  I greatly enjoyed meeting Jerelyn Scotthomas Kyle Twitty and look forward to participating in their care.  A copy of this report was sent to the requesting provider on  this date.  Electronically Signed: Elwin Mocha, PA-C 05/16/2018, 1:05 PM   I spent a total of 40 Minutes in face to face in clinical consultation, greater than 50% of which was counseling/coordinating care for left PCA P1 segment occlusion s/p revascularization AND left VA V2, V3, and V4 segments acute dissection s/p reconstruction.

## 2018-12-13 ENCOUNTER — Telehealth (HOSPITAL_COMMUNITY): Payer: Self-pay

## 2018-12-13 NOTE — Telephone Encounter (Signed)
Pt due for f/u cta head/neck. Pt lives 3 hours away and wants done there by PCP. I called Emily Crawford(PCP) to let them know what f/u was needed. Left message with front desk. They will call once scheduled/done. AW

## 2019-08-13 DIAGNOSIS — Z0289 Encounter for other administrative examinations: Secondary | ICD-10-CM

## 2019-12-20 IMAGING — CT CT ANGIO NECK
1 of 12 series · 5 of 33 positions shown · IV contrast (OMNI 350)
Comparison: MR brain and MRA 01/03/2018. Catheter angiogram
01/02/2018. prior CTA 01/02/2018.

CLINICAL DATA: Status post revascularization of the occluded LEFT
PCA P1 segment, ZXDXG revascularization. Reconstruction of the LEFT
vertebral artery in its V2, V3, and V4 segments with a pipeline flow
diverter. History of hypertension. Remote LEFT occipital, LEFT
posterior temporal, and LEFT greater than RIGHT cerebellar infarcts.

EXAM:
CT ANGIOGRAPHY HEAD AND NECK
TECHNIQUE: Multidetector CT imaging of the head and neck was performed using
the standard protocol during bolus administration of intravenous
contrast. Multiplanar CT image reconstructions and MIPs were
obtained to evaluate the vascular anatomy. Carotid stenosis
measurements (when applicable) are obtained utilizing NASCET
criteria, using the distal internal carotid diameter as the
denominator.
CONTRAST:  75mL GR5IY1-8GM IOPAMIDOL (GR5IY1-8GM) INJECTION 76%

[Series 13: cta neck axial · axial · 0.39mm/px · z∈[-307,-47]mm · 5 of 390 slices shown]
[im 65/390  soft-tissue]
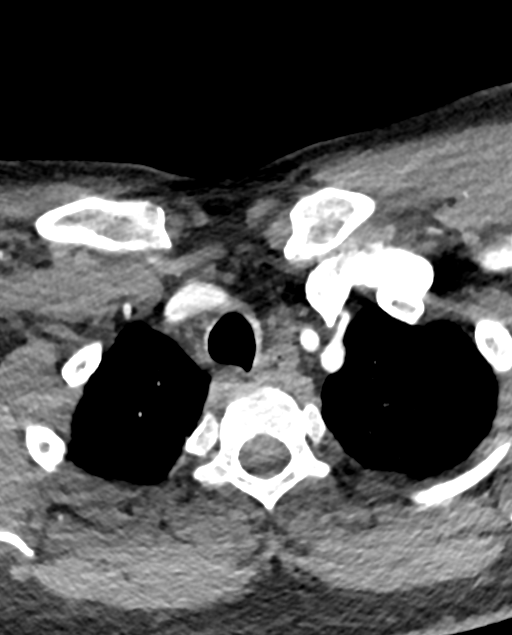
[im 130/390  bone]
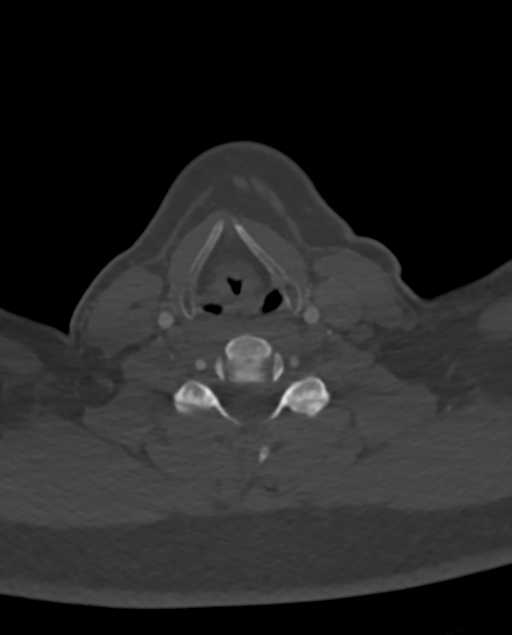
[im 195/390  soft-tissue]
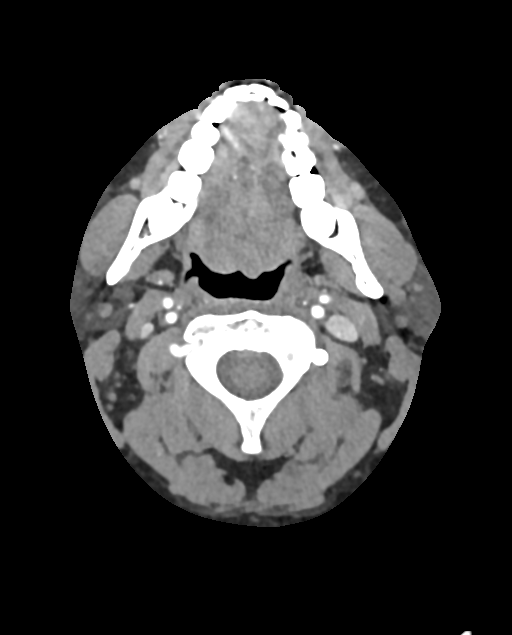
[im 260/390  bone]
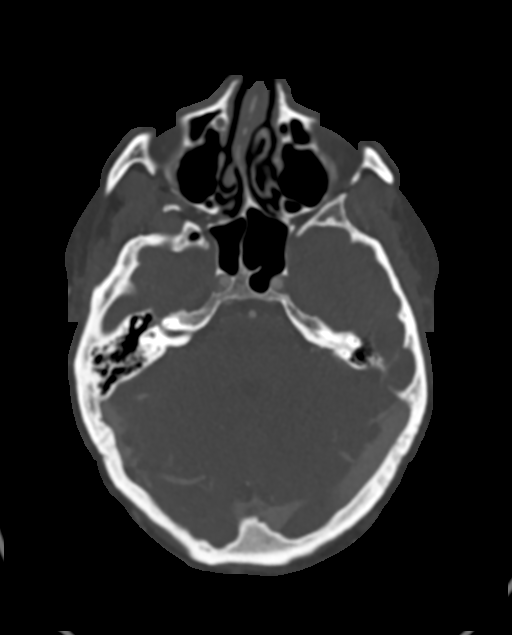
[im 325/390  soft-tissue]
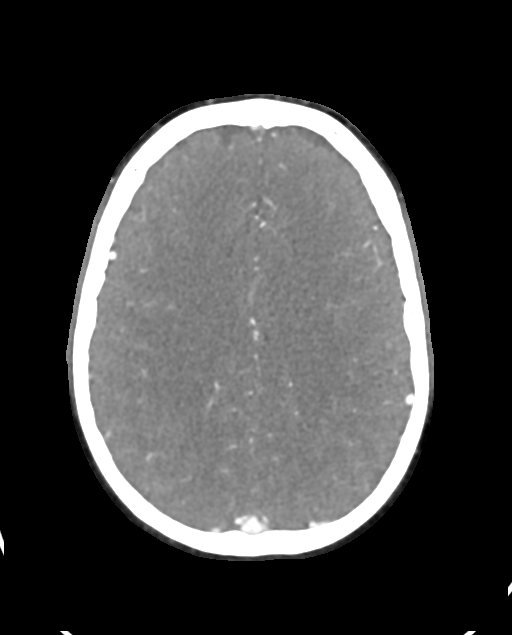

[5 of 33 positions shown; findings below may reference images not displayed]

FINDINGS: CT HEAD FINDINGS

Brain: No acute stroke, acute hemorrhage, mass lesion,
hydrocephalus, or extra-axial fluid. Normal for age cerebral volume.

Chronic LEFT PCA temporal occipital and LEFT AICA cerebellar
infarcts. No new areas of infarction are seen.

Vascular: Reported separately

Skull: Intact

Sinuses: Retention cysts maxillary sinuses.

Orbits: No acute findings.

Review of the MIP images confirms the above findings

CTA NECK FINDINGS

Aortic arch: Standard branching. Imaged portion shows no evidence of
aneurysm or dissection. No significant stenosis of the major arch
vessel origins.

Right carotid system: No evidence of dissection, stenosis (50% or
greater) or occlusion.

Left carotid system: No evidence of dissection, stenosis (50% or
greater) or occlusion.

Vertebral arteries: Both vertebral arteries are patent. Widely
patent telescoping pipeline diverter stents across the LEFT
vertebral dissection, involving the distal V2 and entire V3
segments.

Skeleton: Slight reversal normal cervical lordotic curve. No
worrisome osseous lesions. Possible spinal stenosis C4-C5 due to
central protrusion.

Other neck: Noncontributory.

Upper chest: No mass or pneumothorax.

Review of the MIP images confirms the above findings

CTA HEAD FINDINGS

Anterior circulation: No significant stenosis, proximal occlusion,
aneurysm, or vascular malformation.

Posterior circulation: No significant stenosis, proximal occlusion,
aneurysm, or vascular malformation.Specific attention directed to
the LEFT PCA, and LEFT AICA, widely patent.

Venous sinuses: As permitted by contrast timing, patent.

Anatomic variants: None of significance

Delayed phase: No abnormal postcontrast enhancement.

Review of the MIP images confirms the above findings
IMPRESSION: 1. Unremarkable CTA head and neck. No significant stenosis,
dissection, or occlusion.
2. Widely patent telescoping pipeline diverter stents across the
LEFT vertebral dissection.
3. Chronic posterior circulation infarctions as described. No
interval/new areas of cerebral ischemia are observed since the
previous insult.
